# Patient Record
Sex: Male | Born: 1985 | Race: Black or African American | Hispanic: No | Marital: Single | Smoking: Never smoker
Health system: Southern US, Community
[De-identification: ages and names within clinical notes are randomized; demographics above are authoritative.]

## PROBLEM LIST (undated history)

## (undated) DIAGNOSIS — J45909 Unspecified asthma, uncomplicated: Secondary | ICD-10-CM

## (undated) HISTORY — PX: APPENDECTOMY: SHX54

---

## 2014-09-10 ENCOUNTER — Inpatient Hospital Stay (HOSPITAL_COMMUNITY)
Admission: EM | Admit: 2014-09-10 | Discharge: 2014-09-16 | DRG: 975 | Attending: Internal Medicine | Admitting: Internal Medicine

## 2014-09-10 ENCOUNTER — Encounter (HOSPITAL_COMMUNITY): Payer: Self-pay | Admitting: Emergency Medicine

## 2014-09-10 ENCOUNTER — Emergency Department (HOSPITAL_COMMUNITY)

## 2014-09-10 DIAGNOSIS — Z7951 Long term (current) use of inhaled steroids: Secondary | ICD-10-CM

## 2014-09-10 DIAGNOSIS — E871 Hypo-osmolality and hyponatremia: Secondary | ICD-10-CM | POA: Diagnosis present

## 2014-09-10 DIAGNOSIS — Z8701 Personal history of pneumonia (recurrent): Secondary | ICD-10-CM

## 2014-09-10 DIAGNOSIS — D649 Anemia, unspecified: Secondary | ICD-10-CM | POA: Diagnosis present

## 2014-09-10 DIAGNOSIS — B379 Candidiasis, unspecified: Secondary | ICD-10-CM | POA: Diagnosis not present

## 2014-09-10 DIAGNOSIS — J189 Pneumonia, unspecified organism: Secondary | ICD-10-CM | POA: Diagnosis present

## 2014-09-10 DIAGNOSIS — B59 Pneumocystosis: Secondary | ICD-10-CM | POA: Diagnosis present

## 2014-09-10 DIAGNOSIS — E860 Dehydration: Secondary | ICD-10-CM | POA: Diagnosis present

## 2014-09-10 DIAGNOSIS — J45909 Unspecified asthma, uncomplicated: Secondary | ICD-10-CM | POA: Diagnosis present

## 2014-09-10 DIAGNOSIS — B370 Candidal stomatitis: Secondary | ICD-10-CM | POA: Diagnosis present

## 2014-09-10 DIAGNOSIS — Z9889 Other specified postprocedural states: Secondary | ICD-10-CM

## 2014-09-10 DIAGNOSIS — Z653 Problems related to other legal circumstances: Secondary | ICD-10-CM | POA: Diagnosis not present

## 2014-09-10 DIAGNOSIS — B20 Human immunodeficiency virus [HIV] disease: Secondary | ICD-10-CM | POA: Diagnosis present

## 2014-09-10 DIAGNOSIS — Z21 Asymptomatic human immunodeficiency virus [HIV] infection status: Secondary | ICD-10-CM | POA: Diagnosis not present

## 2014-09-10 HISTORY — DX: Unspecified asthma, uncomplicated: J45.909

## 2014-09-10 LAB — CBC
HEMATOCRIT: 34.7 % — AB (ref 39.0–52.0)
HEMOGLOBIN: 11.7 g/dL — AB (ref 13.0–17.0)
MCH: 27.5 pg (ref 26.0–34.0)
MCHC: 33.7 g/dL (ref 30.0–36.0)
MCV: 81.5 fL (ref 78.0–100.0)
Platelets: 302 10*3/uL (ref 150–400)
RBC: 4.26 MIL/uL (ref 4.22–5.81)
RDW: 14.8 % (ref 11.5–15.5)
WBC: 9.5 10*3/uL (ref 4.0–10.5)

## 2014-09-10 LAB — STREP PNEUMONIAE URINARY ANTIGEN: STREP PNEUMO URINARY ANTIGEN: NEGATIVE

## 2014-09-10 LAB — BASIC METABOLIC PANEL
Anion gap: 11 (ref 5–15)
BUN: 12 mg/dL (ref 6–20)
CHLORIDE: 100 mmol/L — AB (ref 101–111)
CO2: 21 mmol/L — ABNORMAL LOW (ref 22–32)
CREATININE: 0.8 mg/dL (ref 0.61–1.24)
Calcium: 8.9 mg/dL (ref 8.9–10.3)
GFR calc Af Amer: >60 mL/min (ref >60)
GFR calc non Af Amer: >60 mL/min (ref >60)
Glucose, Bld: 103 mg/dL — ABNORMAL HIGH (ref 65–99)
Potassium: 3.7 mmol/L (ref 3.5–5.1)
Sodium: 132 mmol/L — ABNORMAL LOW (ref 135–145)

## 2014-09-10 LAB — I-STAT CG4 LACTIC ACID, ED: Lactic Acid, Venous: 0.64 mmol/L (ref 0.5–2.0)

## 2014-09-10 LAB — HIV ANTIBODY (ROUTINE TESTING W REFLEX): HIV Screen 4th Generation wRfx: REACTIVE — AB

## 2014-09-10 LAB — MRSA PCR SCREENING: MRSA by PCR: NEGATIVE

## 2014-09-10 LAB — RAPID STREP SCREEN (MED CTR MEBANE ONLY): Streptococcus, Group A Screen (Direct): NEGATIVE

## 2014-09-10 MED ORDER — ACETAMINOPHEN 325 MG PO TABS
650.0000 mg | ORAL_TABLET | Freq: Four times a day (QID) | ORAL | Status: DC | PRN
  Administered 2014-09-15: 650 mg via ORAL
  Filled 2014-09-10: qty 2

## 2014-09-10 MED ORDER — LEVOFLOXACIN IN D5W 750 MG/150ML IV SOLN
750.0000 mg | Freq: Once | INTRAVENOUS | Status: AC
  Administered 2014-09-10: 750 mg via INTRAVENOUS
  Filled 2014-09-10: qty 150

## 2014-09-10 MED ORDER — IPRATROPIUM BROMIDE 0.02 % IN SOLN
0.5000 mg | Freq: Once | RESPIRATORY_TRACT | Status: AC
  Administered 2014-09-10: 0.5 mg via RESPIRATORY_TRACT
  Filled 2014-09-10: qty 2.5

## 2014-09-10 MED ORDER — SODIUM CHLORIDE 0.9 % IV SOLN
INTRAVENOUS | Status: DC
  Administered 2014-09-10 – 2014-09-14 (×6): via INTRAVENOUS
  Administered 2014-09-14: 1000 mL via INTRAVENOUS
  Administered 2014-09-15 (×2): via INTRAVENOUS
  Administered 2014-09-15 – 2014-09-16 (×2): 1000 mL via INTRAVENOUS

## 2014-09-10 MED ORDER — ACETAMINOPHEN 325 MG PO TABS
650.0000 mg | ORAL_TABLET | Freq: Once | ORAL | Status: AC | PRN
  Administered 2014-09-10: 650 mg via ORAL
  Filled 2014-09-10: qty 2

## 2014-09-10 MED ORDER — ONDANSETRON HCL 4 MG PO TABS: 4.0000 mg | ORAL_TABLET | Freq: Four times a day (QID) | ORAL | Status: DC | PRN

## 2014-09-10 MED ORDER — ENOXAPARIN SODIUM 40 MG/0.4ML ~~LOC~~ SOLN
40.0000 mg | SUBCUTANEOUS | Status: DC
  Administered 2014-09-10 – 2014-09-14 (×5): 40 mg via SUBCUTANEOUS
  Filled 2014-09-10 (×5): qty 0.4

## 2014-09-10 MED ORDER — LEVOFLOXACIN IN D5W 750 MG/150ML IV SOLN
750.0000 mg | INTRAVENOUS | Status: DC
  Administered 2014-09-11 – 2014-09-12 (×2): 750 mg via INTRAVENOUS
  Filled 2014-09-10 (×3): qty 150

## 2014-09-10 MED ORDER — ACETAMINOPHEN 650 MG RE SUPP: 650.0000 mg | Freq: Four times a day (QID) | RECTAL | Status: DC | PRN

## 2014-09-10 MED ORDER — FLUCONAZOLE IN SODIUM CHLORIDE 200-0.9 MG/100ML-% IV SOLN: 200.0000 mg | INTRAVENOUS | Status: DC

## 2014-09-10 MED ORDER — ALBUTEROL SULFATE (2.5 MG/3ML) 0.083% IN NEBU
2.5000 mg | INHALATION_SOLUTION | Freq: Once | RESPIRATORY_TRACT | Status: AC
  Administered 2014-09-10: 2.5 mg via RESPIRATORY_TRACT
  Filled 2014-09-10: qty 3

## 2014-09-10 MED ORDER — FLUCONAZOLE 100 MG PO TABS
200.0000 mg | ORAL_TABLET | Freq: Every day | ORAL | Status: DC
  Administered 2014-09-11 – 2014-09-16 (×6): 200 mg via ORAL
  Filled 2014-09-10: qty 2
  Filled 2014-09-10 (×2): qty 1
  Filled 2014-09-10 (×2): qty 2
  Filled 2014-09-10: qty 1

## 2014-09-10 MED ORDER — FLUCONAZOLE 200 MG PO TABS
200.0000 mg | ORAL_TABLET | Freq: Once | ORAL | Status: AC
  Administered 2014-09-10: 200 mg via ORAL
  Filled 2014-09-10: qty 1

## 2014-09-10 MED ORDER — ALBUTEROL SULFATE (2.5 MG/3ML) 0.083% IN NEBU
2.5000 mg | INHALATION_SOLUTION | RESPIRATORY_TRACT | Status: DC | PRN
  Administered 2014-09-11 – 2014-09-14 (×5): 2.5 mg via RESPIRATORY_TRACT
  Filled 2014-09-10 (×5): qty 3

## 2014-09-10 MED ORDER — ALUM & MAG HYDROXIDE-SIMETH 200-200-20 MG/5ML PO SUSP: 30.0000 mL | Freq: Four times a day (QID) | ORAL | Status: DC | PRN

## 2014-09-10 MED ORDER — SODIUM CHLORIDE 0.9 % IV BOLUS (SEPSIS)
1000.0000 mL | Freq: Once | INTRAVENOUS | Status: AC
  Administered 2014-09-10: 1000 mL via INTRAVENOUS

## 2014-09-10 MED ORDER — CETYLPYRIDINIUM CHLORIDE 0.05 % MT LIQD
7.0000 mL | Freq: Two times a day (BID) | OROMUCOSAL | Status: DC
  Administered 2014-09-10 – 2014-09-16 (×11): 7 mL via OROMUCOSAL

## 2014-09-10 MED ORDER — HYDROMORPHONE HCL 1 MG/ML IJ SOLN: 0.5000 mg | INTRAMUSCULAR | Status: DC | PRN

## 2014-09-10 MED ORDER — ALBUTEROL SULFATE (2.5 MG/3ML) 0.083% IN NEBU
2.5000 mg | INHALATION_SOLUTION | Freq: Four times a day (QID) | RESPIRATORY_TRACT | Status: DC
  Filled 2014-09-10: qty 3

## 2014-09-10 MED ORDER — SULFAMETHOXAZOLE-TRIMETHOPRIM 800-160 MG PO TABS
1.0000 | ORAL_TABLET | Freq: Once | ORAL | Status: AC
  Administered 2014-09-10: 1 via ORAL
  Filled 2014-09-10: qty 1

## 2014-09-10 MED ORDER — ONDANSETRON HCL 4 MG/2ML IJ SOLN: 4.0000 mg | Freq: Four times a day (QID) | INTRAMUSCULAR | Status: DC | PRN

## 2014-09-10 NOTE — Progress Notes (Signed)
   Follow Up Note  Pt admitted earlier this morning.  Seen after arrived to floor.  Currently feeling somewhat better. No labored breathing. No pain.  Exam: Oral: Thrush on tongue CV: Regular rate and rhythm, S1-S2 Lungs: Good inspiratory effort, no wheezing. Abd: Soft, nontender, nondistended, positive bowel sounds Ext: No clubbing or cyanosis or edema  Present on Admission:  . CAP (community acquired pneumonia): Continue antibiotics. Noted multi lobar.  . Anemia, mild. Stable.  Marland Kitchen Hyponatremia: Secondary dehydration. Mild. Oral thrush: On Diflucan, awaiting HIV testing  Check ambulatory pulse ox. Awaiting HIV. Potential discharge tomorrow.

## 2014-09-10 NOTE — ED Notes (Signed)
Pt. Ambulated down the hall and back to his room on 94% room air, heart rate 80. Pt. Had no complaints.

## 2014-09-10 NOTE — ED Notes (Signed)
Called Respiratory to bedside. 

## 2014-09-10 NOTE — ED Notes (Signed)
Patient is feeling bad. He states that he have not felt good and went to infirmary for not feeling good. He also states that his asthma is acting up and is sweating.

## 2014-09-10 NOTE — ED Notes (Signed)
PA at bedside.

## 2014-09-10 NOTE — H&P (Addendum)
[facility] Admission History and Physical       Henry Mitchell ZOX:[id number] DOB: 1985-10-30 DOA: 09/10/2014  Referring physician: EDP PCP: No primary care provider on file.  Specialists:   Chief Complaint: SOB Cough Fevers and Chills  HPI: Henry Mitchell is a 29 y.o. male who was brought to the ED from the [facility] (with 2 officers at bedside)  with complaints of fevers and chills cough and SOB x 3 days.   He reports having night sweats.   He was found to have multifocal pneumonia on Chest X-ray and was placed on empiric antibiotic coverage of IV Levaquin and a dose of Oral Bactrim and referred for admission.    Of Note he was admitted with Pneumonia 3 months ago.     Review of Systems:  Constitutional: No Weight Loss, No Weight Gain, +Sweats, +Fevers, +Chills, Dizziness, Light Headedness, Fatigue, or Generalized Weakness HEENT: No Headaches, Difficulty Swallowing,Tooth/Dental Problems,Sore Throat,  No Sneezing, Rhinitis, Ear Ache, Nasal Congestion, or Post Nasal Drip,  Cardio-vascular:  No Chest pain, Orthopnea, PND, Edema in Lower Extremities, Anasarca, Dizziness, Palpitations  Resp: +Dyspnea, No DOE, +Productive Cough, No Non-Productive Cough, No Hemoptysis, No Wheezing.    GI: No Heartburn, Indigestion, Abdominal Pain, Nausea, Vomiting, Diarrhea, Constipation, Hematemesis, Hematochezia, Melena, Change in Bowel Habits,  Loss of Appetite  GU: No Dysuria, No Change in Color of Urine, No Urgency or Urinary Frequency, No Flank pain.  Musculoskeletal: No Joint Pain or Swelling, No Decreased Range of Motion, No Back Pain.  Neurologic: No Syncope, No Seizures, Muscle Weakness, Paresthesia, Vision Disturbance or Loss, No Diplopia, No Vertigo, No Difficulty Walking,  Skin: No Rash or Lesions. Psych: No Change in Mood or Affect, No Depression or Anxiety, No Memory loss, No Confusion, or Hallucinations   Past Medical History  Diagnosis Date  . Asthma      Past Surgical  History  Procedure Laterality Date  . Appendectomy        Prior to Admission medications   Medication Sig Start Date End Date Taking? Authorizing Provider  albuterol (PROVENTIL HFA;VENTOLIN HFA) 108 (90 BASE) MCG/ACT inhaler Inhale 2 puffs into the lungs every 4 (four) hours as needed for wheezing or shortness of breath.   Yes Historical Provider, MD     No Known Allergies  Social History:  reports that he has never smoked. He has never used smokeless tobacco. He reports that he does not drink alcohol or use illicit drugs.    History reviewed. No pertinent family history.     Physical Exam:  GEN:  Pleasant Thin  29 y.o. African American male examined and in no acute distress; cooperative with exam Filed Vitals:   09/10/14 0100 09/10/14 0130 09/10/14 0157 09/10/14 0339  BP: 116/77 118/75  111/56  Pulse: 109 115  106  Temp:    98.9 F (37.2 C)  TempSrc:    Oral  Resp: SpO2: 100% 99% 98% 94%   Blood pressure 111/56, pulse 106, temperature 98.9 F (37.2 C), temperature source Oral, resp. rate 24, SpO2 94 %. PSYCH: He is alert and oriented x4; does not appear anxious does not appear depressed; affect is normal HEENT: Normocephalic and Atraumatic, Mucous membranes pink; PERRLA; EOM intact; Fundi:  Benign;  No scleral icterus, Nares: Patent, Oropharynx: + white Tongue Exudates, Fair Dentition,    Neck:  FROM, No Cervical Lymphadenopathy nor Thyromegaly or Carotid Bruit; No JVD; Breasts:: Not examined CHEST WALL: No tenderness CHEST: Normal respiration, clear  to auscultation bilaterally HEART: Regular rate and rhythm; no murmurs rubs or gallops BACK: No kyphosis or scoliosis; No CVA tenderness ABDOMEN: Positive Bowel Sounds, Scaphoid, Soft Non-Tender, No Rebound or Guarding; No Masses, No Organomegaly Rectal Exam: Not done EXTREMITIES: No Cyanosis, Clubbing, or Edema; No Ulcerations. Genitalia: not examined PULSES: 2+ and symmetric SKIN: Normal hydration no rash or  ulceration CNS:  Alert and Oriented x 4, No Focal Deficits Vascular: pulses palpable throughout    Labs on Admission:  Basic Metabolic Panel:  Recent Labs Lab 09/10/14 0225  NA 132*  K 3.7  CL 100*  CO2 21*  GLUCOSE 103*  BUN 12  CREATININE 0.80  CALCIUM 8.9   Liver Function Tests: No results for input(s): AST, ALT, ALKPHOS, BILITOT, PROT, ALBUMIN in the last 168 hours. No results for input(s): LIPASE, AMYLASE in the last 168 hours. No results for input(s): AMMONIA in the last 168 hours. CBC:  Recent Labs Lab 09/10/14 0225  WBC 9.5  HGB 11.7*  HCT 34.7*  MCV 81.5  PLT 302   Cardiac Enzymes: No results for input(s): CKTOTAL, CKMB, CKMBINDEX, TROPONINI in the last 168 hours.  BNP (last 3 results) No results for input(s): BNP in the last 8760 hours.  ProBNP (last 3 results) No results for input(s): PROBNP in the last 8760 hours.  CBG: No results for input(s): GLUCAP in the last 168 hours.  Radiological Exams on Admission: Dg Chest 2 View  09/10/2014   CLINICAL DATA:  Cough and fever.  EXAM: CHEST  2 VIEW  COMPARISON:  None.  FINDINGS: Right upper lobe patchy consolidation, moderate in degree. Minimal patchy opacity in the left suprahilar and infrahilar lung. The heart size and mediastinal contours are normal. Pulmonary vasculature is normal. No pleural effusion or pneumothorax. No osseous abnormality.  IMPRESSION: Multifocal pneumonia, most significant in the right upper lobe.   Electronically Signed   By: Henry Mitchell M.D.   On: 09/10/2014 02:16     Assessment/Plan:   29 y.o. male with  Principal Problem:   1.    CAP (community acquired pneumonia)   IV Levaquin   PO Bactrim x 1 dose given   Albuterol Nebs PRN   O2 PRN   Active Problems:   2.    Oral candidiasis   Oral Fluconazole   Check  for HIV      3.    Anemia   Send Anemia panel   Send FOBT q day x3   Monitor Hb     4.    Hyponatremia- due to #1   IVFs     5.    DVT  Prophylaxis   Lovenox    Code Status:     FULL CODE      Family Communication:   No Family Present    Disposition Plan:    Inpatient Status        Time spent:  42 Minutes      Henry Mitchell [facility] Pager [phone]   If 7AM -7PM Please Contact the Day Rounding Team MD for [facility]  If 7PM-7AM, Please Contact Night-Floor Coverage  [email or web address] Password [email or web address] 09/10/2014, 5:08 AM     ADDENDUM:   Patient was seen and examined on 09/10/2014

## 2014-09-10 NOTE — ED Provider Notes (Signed)
CSN: [id number]     Arrival date & time 09/10/14  0018 History   First MD Initiated Contact with Patient 09/10/14 0059     Chief Complaint  Patient presents with  . Fever  . Thrush  . Sore Throat  . Cough     (Consider location/radiation/quality/duration/timing/severity/associated sxs/prior Treatment) HPI Comments: 29 year old male with a history of asthma who presents to the emergency department from [facility] for complaints of illness. He reports that he feels similar to when he had pneumonia a few months ago. He states that his symptoms began 3 days ago as a cough productive of green sputum. He has since developed central chest pain which is worse with deep breathing. Patient complaining of body aches. He has had nasal congestion as well as runny nose and a mild sore throat. He denies any known sick contacts. He uses an albuterol inhaler for his asthma. No other medications taken prior to arrival, per patient. Fever of 100.32F noted on arrival.  Patient is a 29 y.o. male presenting with fever, pharyngitis, and cough. The history is provided by the patient. No language interpreter was used.  Fever Associated symptoms: chest pain, chills, cough and myalgias   Sore Throat Associated symptoms include chest pain, chills, coughing, a fever and myalgias.  Cough Associated symptoms: chest pain, chills, fever, myalgias and shortness of breath     Past Medical History  Diagnosis Date  . Asthma    Past Surgical History  Procedure Laterality Date  . Appendectomy     History reviewed. No pertinent family history. Social History  Substance Use Topics  . Smoking status: Never Smoker   . Smokeless tobacco: Never Used  . Alcohol Use: No    Review of Systems  Constitutional: Positive for fever and chills.  Respiratory: Positive for cough, chest tightness and shortness of breath.   Cardiovascular: Positive for chest pain.  Musculoskeletal: Positive for myalgias.  All other systems  reviewed and are negative.   Allergies  Review of patient's allergies indicates no known allergies.  Home Medications   Prior to Admission medications   Medication Sig Start Date End Date Taking? Authorizing Provider  albuterol (PROVENTIL HFA;VENTOLIN HFA) 108 (90 BASE) MCG/ACT inhaler Inhale 2 puffs into the lungs every 4 (four) hours as needed for wheezing or shortness of breath.   Yes Historical Provider, MD   BP 118/75 mmHg  Pulse 115  Temp(Src) 100.7 F (38.2 C) (Oral)  Resp 24  SpO2 98%   Physical Exam  Constitutional: He is oriented to person, place, and time. He appears well-developed and well-nourished. No distress.  HENT:  Head: Normocephalic and atraumatic.  Diffuse oral thrush noted to hard and soft palate, buccal mucosa, and tongue. Patient tolerating secretions without difficulty.  Eyes: Conjunctivae and EOM are normal. No scleral icterus.  Neck: Normal range of motion.  Cardiovascular: Regular rhythm and intact distal pulses.   Tachycardia  Pulmonary/Chest: Effort normal. No respiratory distress. He has no wheezes. He has rales.  Diffuse rhonchi on expiration. Rales appreciated in b/l upper lobes. No wheezes appreciated. Chest expansion symmetric. Mild tachypnea without dyspnea.  Musculoskeletal: Normal range of motion.  Neurological: He is alert and oriented to person, place, and time. He exhibits normal muscle tone. Coordination normal.  Skin: Skin is warm and dry. No rash noted. He is not diaphoretic. No erythema. No pallor.  Psychiatric: He has a normal mood and affect. His behavior is normal.  Nursing note and vitals reviewed.   ED Course  Procedures (including critical care time) Labs Review Labs Reviewed  CBC - Abnormal; Notable for the following:    Hemoglobin 11.7 (*)    HCT 34.7 (*)    All other components within normal limits  BASIC METABOLIC PANEL - Abnormal; Notable for the following:    Sodium 132 (*)    Chloride 100 (*)    CO2 21 (*)     Glucose, Bld 103 (*)    All other components within normal limits  RAPID STREP SCREEN (NOT AT [facility])  CULTURE, GROUP A STREP  CULTURE, BLOOD (ROUTINE X 2)  CULTURE, BLOOD (ROUTINE X 2)  HIV ANTIBODY (ROUTINE TESTING)  I-STAT CG4 LACTIC ACID, ED    Imaging Review Dg Chest 2 View  09/10/2014   CLINICAL DATA:  Cough and fever.  EXAM: CHEST  2 VIEW  COMPARISON:  None.  FINDINGS: Right upper lobe patchy consolidation, moderate in degree. Minimal patchy opacity in the left suprahilar and infrahilar lung. The heart size and mediastinal contours are normal. Pulmonary vasculature is normal. No pleural effusion or pneumothorax. No osseous abnormality.  IMPRESSION: Multifocal pneumonia, most significant in the right upper lobe.   Electronically Signed   By: Rubye Oaks M.D.   On: 09/10/2014 02:16   I have personally reviewed and evaluated these images and lab results as part of my medical decision-making.   EKG Interpretation None      MDM   Final diagnoses:  CAP (community acquired pneumonia)  Oral candidiasis    29 year old male, currently incarcerated, presents [facility] the emergency department from jail for complaints of cough, upper respiratory symptoms, and fever. Patient febrile to 100.58F on arrival. He was also noted to be tachycardic which improved with IV fluids. He responded well to Tylenol. Workup today shows a multifocal pneumonia, most significant in the right upper lobe.  Patient's physical exam today was significant for extensive oral thrush. Patient also reports a history of pneumonia approximately 6 months ago. He states that he has had oral thrush in the past which has been hard to resolve with nystatin alone. He was put on Diflucan tablets which eventually helped him. He has been having distant issues with thrush over the past year. Patient also states he has been having fairly frequent pneumonias over the past year. He denies concern for HIV, but has not been tested for HIV in  the last few years. He cannot recall any encounter where HIV may have been transmitted to him.  Patient given IV Levaquin in the emergency department for treatment of multifocal pneumonia. There is, however, an underlying concern that patient's pneumonia may be related to undiagnosed HIV also causing worsening thrush. Patient given Bactrim in the emergency department as well for prophylaxis until HIV test result. Patient to be admitted for additional antibiotics and monitoring given the extent of his pneumonia on chest xray with current incarceration.   Filed Vitals:   09/10/14 0100 09/10/14 0130 09/10/14 0157 09/10/14 0339  BP: 116/77 118/75  111/56  Pulse: 109 115  106  Temp:    98.9 F (37.2 C)  TempSrc:    Oral  Resp: SpO2: 100% 99% 98% 94%     Antony Madura, PA-C 09/10/14 0531  Marisa Severin, MD 09/10/14 2110

## 2014-09-11 DIAGNOSIS — B20 Human immunodeficiency virus [HIV] disease: Secondary | ICD-10-CM | POA: Diagnosis present

## 2014-09-11 LAB — BASIC METABOLIC PANEL
ANION GAP: 5 (ref 5–15)
BUN: 8 mg/dL (ref 6–20)
CHLORIDE: 106 mmol/L (ref 101–111)
CO2: 23 mmol/L (ref 22–32)
Calcium: 8.8 mg/dL — ABNORMAL LOW (ref 8.9–10.3)
Creatinine, Ser: 0.82 mg/dL (ref 0.61–1.24)
GFR calc Af Amer: >60 mL/min (ref >60)
GFR calc non Af Amer: >60 mL/min (ref >60)
GLUCOSE: 98 mg/dL (ref 65–99)
POTASSIUM: 4.4 mmol/L (ref 3.5–5.1)
Sodium: 134 mmol/L — ABNORMAL LOW (ref 135–145)

## 2014-09-11 LAB — CBC
HEMATOCRIT: 36.6 % — AB (ref 39.0–52.0)
HEMOGLOBIN: 12.1 g/dL — AB (ref 13.0–17.0)
MCH: 27.1 pg (ref 26.0–34.0)
MCHC: 33.1 g/dL (ref 30.0–36.0)
MCV: 81.9 fL (ref 78.0–100.0)
Platelets: 327 10*3/uL (ref 150–400)
RBC: 4.47 MIL/uL (ref 4.22–5.81)
RDW: 14.9 % (ref 11.5–15.5)
WBC: 10.1 10*3/uL (ref 4.0–10.5)

## 2014-09-11 NOTE — Progress Notes (Signed)
Henry NOTE  Christell Constant ZOX:[id number] DOB: 28-Apr-1985 DOA: 09/10/2014 PCP: No primary care provider on file.  HPI/Recap of past 80 hours: 29 year old African-American male admitted from jail on the early morning of 8/27 for multilobar pneumonia and oral thrush. Patient started on antibiotics and Diflucan. 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 He is understandably upset. No previous history. He states he had a negative test several years ago. He does not know how he contracted this. Denies any IV drug use. CD4 count ordered and is pending. Spoke with infectious disease. We'll make this somewhat difficult as the patient is from [place] and given that he has no insurance, we'll need to enroll in [facility]'s medication assistance program area Active Problems:   CAP (community acquired pneumonia): Does not appear his PCP pneumonia. Continue antibiotics   Oral candidiasis: Continue Diflucan   Anemia: Mild.   Hyponatremia   Code Status: Full code  Family Communication: Unable to contact family due to prison rules  Disposition Plan: Anticipate discharge in next 1-2 days once CD4 count back and arrangements made for follow-up   Consultants:  Case discussed with infectious disease  Procedures:  None  Antibiotics:  Levaquin 8/27-present   Objective: BP 110/63 mmHg  Pulse 100  Temp(Src) 98.9 F (37.2 C) (Oral)  Resp 16  Ht 5\' 7"  (1.702 m)  Wt 73.6 kg (162 lb 4.1 oz)  BMI 25.41 kg/m2  SpO2 100%  Intake/Output Summary (Last 24 hours) at 09/11/14 1316 Last data filed at 09/11/14 0900  Gross per 24 hour  Intake   1120 ml  Output   1075 ml  Net     45 ml   Filed Weights   09/10/14 0900 09/11/14 0444  Weight: 74.2 kg (163 lb 9.3 oz) 73.6 kg (162 lb 4.1 oz)     Exam:   General:  Alert and oriented 3, in moderate distress from diagnosis  HEENT: Oral thrush noted  Cardiovascular: Regular rate and rhythm, S1-S2  Respiratory: Clear to auscultation bilaterally  Abdomen: Soft nontender, nondistended, positive bowel sounds  Musculoskeletal: No clubbing or cyanosis or edema   Data Reviewed: Basic Metabolic Panel:  Recent Labs Lab 09/10/14 0225 09/11/14 0444  NA 132* 134*  K 3.7 4.4  CL 100* 106  CO2 21* 23  GLUCOSE 103* 98  BUN 12 8  CREATININE 0.80 0.82  CALCIUM 8.9 8.8*   Liver Function Tests: No results for input(s): AST, ALT, ALKPHOS, BILITOT, PROT, ALBUMIN in the last 168 hours. No results for input(s): LIPASE, AMYLASE in the last 168 hours. No results for input(s): AMMONIA in the last 168 hours. CBC:  Recent Labs Lab 09/10/14 0225 09/11/14 0444  WBC 9.5 10.1  HGB 11.7* 12.1*  HCT 34.7* 36.6*  MCV 81.5 81.9  PLT 302 327   Cardiac Enzymes:   No results for input(s): CKTOTAL, CKMB, CKMBINDEX, TROPONINI in the last 168 hours. BNP (last 3 results) No results for input(s): BNP in the last 8760 hours.  ProBNP (last 3 results) No results for input(s): PROBNP in the last 8760 hours.  CBG: No results for input(s): GLUCAP in the last 168 hours.  Recent Results (from the past 240 hour(s))  Rapid strep screen (not at [facility])     Status: None   Collection Time: 09/10/14  1:21 AM  Result Value Ref  Range Status   Streptococcus, Group A Screen (Direct) NEGATIVE NEGATIVE Final    Comment: (NOTE) A Rapid Antigen test may result negative if the antigen level in the sample is below the detection level of this test. The FDA has not cleared this test as a stand-alone test therefore the rapid antigen negative result has reflexed to a Group A Strep culture.   MRSA PCR Screening     Status: None   Collection Time: 09/10/14  6:33 AM  Result Value Ref Range Status   MRSA by PCR NEGATIVE NEGATIVE Final    Comment:        The  GeneXpert MRSA Assay (FDA approved for NASAL specimens only), is one component of a comprehensive MRSA colonization surveillance program. It is not intended to diagnose MRSA infection nor to guide or monitor treatment for MRSA infections.      Studies: No results found.  Scheduled Meds: . antiseptic oral rinse  7 mL Mouth Rinse BID  . enoxaparin (LOVENOX) injection  40 mg Subcutaneous Q24H  . fluconazole  200 mg Oral Daily  . levofloxacin (LEVAQUIN) IV  750 mg Intravenous Q24H    Continuous Infusions: . sodium chloride 100 mL/hr at 09/11/14 4098     Time spent: 25 minutes  Hollice Espy  [facility] Pager [phone]. If 7PM-7AM, please contact night-coverage at [email or web address], password [facility] 09/11/2014, 1:16 PM  LOS: 1 day

## 2014-09-11 NOTE — Progress Notes (Signed)
Utilization review completed.  

## 2014-09-11 NOTE — Progress Notes (Addendum)
Pt was sitting up and awake when I arrived. One or two guards were present during entire visit. 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 He described his reaction and how he felt. He said in addition to his learning this, he can't talk to his family because he is here. (Prison rules will not allow contact with family.)  Early during the visit and the entire visit, pt spoke of his faith in God. He said [place] is [place], is saved and knows that God sits high and looks low. Toward the end of our visit he spoke of having a purpose in all this and said he knows that God can still use him. Pt said he has a girlfriend with whom he will have to share this news. He said he also has three children ages 81 years and 2 children are 1 year olds. I listened as pt described his illness, faith, feelings about his new diagnosis. We talked about the importance of taking one day, sometimes one hour at a time.  He seemed less overwhelmed about everything during that part of our discussion.  Provided pastoral care and prayer for pt.  He was very grateful for visit, encouragement and prayer. Encouraged him to call if he needs additional support.  Please page when pt needs a visit. [phone] Chaplain Elmarie Shiley Holder   09/11/14 1900  Clinical Encounter Type  Visited With Patient

## 2014-09-12 LAB — T-HELPER CELLS (CD4) COUNT (NOT AT ARMC)
CD4 T CELL ABS: 10 /uL — AB (ref 400–2700)
CD4 T CELL HELPER: 2 % — AB (ref 33–55)

## 2014-09-12 LAB — LEGIONELLA ANTIGEN, URINE

## 2014-09-12 LAB — CULTURE, GROUP A STREP: STREP A CULTURE: NEGATIVE

## 2014-09-12 MED ORDER — LEVOFLOXACIN 750 MG PO TABS
750.0000 mg | ORAL_TABLET | ORAL | Status: DC
  Administered 2014-09-12 – 2014-09-13 (×2): 750 mg via ORAL
  Filled 2014-09-12 (×3): qty 1

## 2014-09-12 MED ORDER — TUBERCULIN PPD 5 UNIT/0.1ML ID SOLN
5.0000 [IU] | Freq: Once | INTRADERMAL | Status: AC
  Administered 2014-09-12: 5 [IU] via INTRADERMAL
  Filled 2014-09-12: qty 0.1

## 2014-09-12 NOTE — Progress Notes (Signed)
PPD placed on L forearm.  To be read 09/14/14.

## 2014-09-12 NOTE — Progress Notes (Signed)
PROGRESS NOTE  Christell Constant AVW:[id number] DOB: [birth year] DOA: 09/10/2014 PCP: No primary care provider on file.  HPI/Recap of past 63 hours: 29 year old African-American male admitted from jail on the early morning of 8/27 for multilobar pneumonia and oral thrush. Patient started on antibiotics and Diflucan. Responding well, breathing comfortably on room air. HIV testing came back positive, new diagnosis for patient.  Patient today doing okay. He was understandably upset yesterday after getting this new diagnosis. CD4 count ordered and is still pending. 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 He is understandably upset. No previous history. He states he had a negative test several years ago. He does not know how he contracted this. Denies any IV drug use. CD4 count ordered and is pending. Spoke with infectious disease. We'll make this somewhat difficult as the patient is from [place] and given that he has no insurance, we'll need to enroll in [facility] medication assistance program area Active Problems:   CAP (community acquired pneumonia): Does not appear his PCP pneumonia. Continue antibiotics, changed to by mouth   Oral candidiasis: Continue Diflucan   Anemia: Mild.   Hyponatremia   Code Status: Full code  Family Communication: Unable to contact family due to prison rules  Disposition Plan: Anticipate discharge in next 1-2 days once CD4 count back and arrangements made for follow-up   Consultants:  Case discussed with infectious disease  Procedures:  None  Antibiotics:  Levaquin 8/27-present   Objective: BP 103/66 mmHg  Pulse 103  Temp(Src) 97.9 F (36.6 C) (Oral)  Resp 16  Ht 5\' 7"  (1.702 m)  Wt 73.6 kg (162 lb 4.1 oz)  BMI 25.41 kg/m2  SpO2 100%  Intake/Output  Summary (Last 24 hours) at 09/12/14 1504 Last data filed at 09/12/14 1423  Gross per 24 hour  Intake   1965 ml  Output      0 ml  Net   1965 ml   Filed Weights   09/10/14 0900 09/11/14 0444  Weight: 74.2 kg (163 lb 9.3 oz) 73.6 kg (162 lb 4.1 oz)    Exam:   General:  Alert and oriented 3, breathing comfortably  HEENT: Oral thrush noted  Cardiovascular: Regular rate and rhythm, S1-S2  Respiratory: Clear to auscultation bilaterally, better airway exchange  Abdomen: Soft nontender, nondistended, positive bowel sounds  Musculoskeletal: No clubbing or cyanosis or edema   Data Reviewed: Basic Metabolic Panel:  Recent Labs Lab 09/10/14 0225 09/11/14 0444  NA 132* 134*  K 3.7 4.4  CL 100* 106  CO2 21* 23  GLUCOSE 103* 98  BUN 12 8  CREATININE 0.80 0.82  CALCIUM 8.9 8.8*   Liver Function Tests: No results for input(s): AST, ALT, ALKPHOS, BILITOT, PROT, ALBUMIN in the last 168 hours. No results for input(s): LIPASE, AMYLASE in the last 168 hours. No results for input(s): AMMONIA in the last 168 hours. CBC:  Recent Labs Lab 09/10/14 0225 09/11/14 0444  WBC 9.5 10.1  HGB 11.7* 12.1*  HCT 34.7* 36.6*  MCV 81.5 81.9  PLT 302 327   Cardiac Enzymes:   No results for input(s): CKTOTAL, CKMB, CKMBINDEX, TROPONINI in the last 168 hours. BNP (last 3 results) No results for input(s): BNP in the last 8760 hours.  ProBNP (last 3 results) No results for input(s): PROBNP in the last 8760 hours.  CBG: No results for input(s): GLUCAP in the last 168 hours.  Recent Results (from the past 240 hour(s))  Rapid strep screen (not at [facility])     Status: None   Collection Time: 09/10/14  1:21 AM  Result Value Ref Range Status   Streptococcus, Group A Screen (Direct) NEGATIVE NEGATIVE Final    Comment: (NOTE) A Rapid Antigen test may result negative if the antigen level in the sample is below the detection level of this test. The FDA has not cleared this test as a stand-alone  test therefore the rapid antigen negative result has reflexed to a Group A Strep culture.   Culture, blood (routine x 2)     Status: None (Preliminary result)   Collection Time: 09/10/14  2:30 AM  Result Value Ref Range Status   Specimen Description BLOOD RIGHT FOREARM  Final   Special Requests BOTTLES DRAWN AEROBIC AND ANAEROBIC  Final   Culture   Final    NO GROWTH 2 DAYS Performed at [facility]    Report Status PENDING  Incomplete  Culture, blood (routine x 2)     Status: None (Preliminary result)   Collection Time: 09/10/14  2:32 AM  Result Value Ref Range Status   Specimen Description BLOOD LEFT ANTECUBITAL  Final   Special Requests BOTTLES DRAWN AEROBIC AND ANAEROBIC  Final   Culture   Final    NO GROWTH 2 DAYS Performed at [facility]    Report Status PENDING  Incomplete  MRSA PCR Screening     Status: None   Collection Time: 09/10/14  6:33 AM  Result Value Ref Range Status   MRSA by PCR NEGATIVE NEGATIVE Final    Comment:        The GeneXpert MRSA Assay (FDA approved for NASAL specimens only), is one component of a comprehensive MRSA colonization surveillance program. It is not intended to diagnose MRSA infection nor to guide or monitor treatment for MRSA infections.      Studies: No results found.  Scheduled Meds: . antiseptic oral rinse  7 mL Mouth Rinse BID  . enoxaparin (LOVENOX) injection  40 mg Subcutaneous Q24H  . fluconazole  200 mg Oral Daily  . levofloxacin  750 mg Oral Q24H    Continuous Infusions: . sodium chloride 100 mL/hr at 09/12/14 1202     Time spent: 15 minutes  Hollice Espy  [facility] Pager [phone]. If 7PM-7AM, please contact night-coverage at [email or web address], password [facility] 09/12/2014, 3:04 PM  LOS: 2 days

## 2014-09-12 NOTE — Progress Notes (Signed)
PHARMACIST - PHYSICIAN COMMUNICATION DR:   TR[facility] CONCERNING: Antibiotic IV to Oral Route Change Policy  RECOMMENDATION: This patient is receiving levofloxacin by the intravenous route.  Based on criteria approved by the Pharmacy and Therapeutics Committee, the antibiotic(s) is/are being converted to the equivalent oral dose form(s).   DESCRIPTION: These criteria include:  Patient being treated for a respiratory tract infection, urinary tract infection, cellulitis or clostridium difficile associated diarrhea if on metronidazole  The patient is not neutropenic and does not exhibit a GI malabsorption state  The patient is eating (either orally or via tube) and/or has been taking other orally administered medications for a least 24 hours  The patient is improving clinically and has a Tmax < 100.5  (WBC was WNL and on room air with good O2 sats)  If you have questions about this conversion, please contact the Pharmacy Department    [phone] )  [facility]   [phone] )  [facility]   [phone] )  [facility]   [phone] )  [facility]   [phone] )  Ilene Qua    Juliette Alcide, PharmD, BCPS.   Pager: [phone] 09/12/2014 8:31 AM

## 2014-09-13 DIAGNOSIS — B370 Candidal stomatitis: Secondary | ICD-10-CM

## 2014-09-13 DIAGNOSIS — Z21 Asymptomatic human immunodeficiency virus [HIV] infection status: Secondary | ICD-10-CM

## 2014-09-13 DIAGNOSIS — J189 Pneumonia, unspecified organism: Principal | ICD-10-CM

## 2014-09-13 LAB — CRYPTOCOCCAL ANTIGEN: Crypto Ag: NEGATIVE

## 2014-09-13 MED ORDER — ELVITEG-COBIC-EMTRICIT-TENOFAF 150-150-200-10 MG PO TABS
1.0000 | ORAL_TABLET | Freq: Every day | ORAL | Status: DC
  Administered 2014-09-14 – 2014-09-16 (×2): 1 via ORAL
  Filled 2014-09-13 (×5): qty 1

## 2014-09-13 MED ORDER — SULFAMETHOXAZOLE-TRIMETHOPRIM 800-160 MG PO TABS: 1.0000 | ORAL_TABLET | Freq: Two times a day (BID) | ORAL | Status: DC

## 2014-09-13 MED ORDER — AZITHROMYCIN 600 MG PO TABS: 1200.0000 mg | ORAL_TABLET | ORAL | Status: DC

## 2014-09-13 MED ORDER — SULFAMETHOXAZOLE-TRIMETHOPRIM 800-160 MG PO TABS
1.0000 | ORAL_TABLET | Freq: Every day | ORAL | Status: DC
  Administered 2014-09-13 – 2014-09-14 (×2): 1 via ORAL
  Filled 2014-09-13 (×2): qty 1

## 2014-09-13 MED ORDER — AZITHROMYCIN 500 MG PO TABS
500.0000 mg | ORAL_TABLET | Freq: Every day | ORAL | Status: DC
Start: 2014-09-13 — End: 2014-09-13

## 2014-09-13 NOTE — Progress Notes (Signed)
PROGRESS NOTE  Henry Mitchell [id number]:[id number] DOB: 1985-12-20 DOA: 09/10/2014 PCP: No primary care provider on file.  HPI/Recap of past 16 hours: 29 year old African-American male admitted from jail on the early morning of 8/27 for multilobar pneumonia and oral thrush. Patient started on antibiotics and Diflucan. Responding well, breathing comfortably on room air. HIV testing came back positive, new diagnosis for patient.  Patient today doing okay. Spiked fever last night. Feels like breathing is overall stable. Anxious to have plan in place so that he can be discharged. CD4 came back at 10 and infectious disease consulted. Patient started on prophylactic Zithromax and Bactrim plus anti-retrovirals.   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 He is understandably upset. No previous history. He states he had a negative test several years ago. He does not know how he contracted this. Denies any IV drug use. Appreciate infectious disease help. Once he is completed Levaquin course, can start weekly Zithromax. Now on Bactrim. Now on anti-retrovirals. Viral load, genotype and other labs ordered. Patient is at increased risk for TB, although low likelihood. Have ordered PPD, but he may not be able to amount enough of the immune response. ID recommending bronchoscopy which they will talk to pulmonary about.   fficult as the patient is from [place] and given that he has no insurance, we'll need to enroll in [facility]'s medication assistance program area Active Problems:   CAP (community acquired pneumonia): Does not appear his PCP pneumonia. Continue antibiotics, changed to by mouth    Oral candidiasis: Continue fluconazole   Anemia: Mild.   Hyponatremia Given concerns and risk for TB  Code Status: Full code  Family Communication: Unable to contact family due to prison rules  Disposition Plan: Anticipate  discharge in next 1-2 days once CD4 count back and arrangements made for follow-up   Consultants:  Infectious disease  Pulmonary   Procedures Planned bronchoscopy  Antibiotics:  Levaquin 8/27-present (continue until 9/2 )   fluconazole 8/27-present  Bactrim DS daily indefinite  Zithromax start once Levaquin finished   Objective: BP 107/64 mmHg  Pulse 102  Temp(Src) 99 F (37.2 C) (Oral)  Resp 20  Ht  (1.702 m)  Wt 73.6 kg (162 lb 4.1 oz)  BMI 25.41 kg/m2  SpO2 99%  Intake/Output Summary (Last 24 hours) at 09/13/14 1717 Last data filed at 09/13/14 1100  Gross per 24 hour  Intake    715 ml  Output      0 ml  Net    715 ml   Filed Weights   09/10/14 0900 09/11/14 0444  Weight: 74.2 kg (163 lb 9.3 oz) 73.6 kg (162 lb 4.1 oz)    Exam:   General:  Alert and oriented 3, breathing comfortably  HEENT: Oral thrush noted  Cardiovascular: Regular rate and rhythm, S1-S2  Respiratory: Clear to auscultation bilaterally  Abdomen: Soft nontender, nondistended, positive bowel sounds  Musculoskeletal: No clubbing or cyanosis or edema   Data Reviewed: Basic Metabolic Panel:  Recent Labs Lab 09/10/14 0225 09/11/14 0444  NA 132* 134*  K 3.7 4.4  CL 100* 106  CO2 21* 23  GLUCOSE 103* 98  BUN 12 8  CREATININE 0.80 0.82  CALCIUM 8.9 8.8*   Liver Function Tests: No results for input(s): AST, ALT, ALKPHOS, BILITOT, PROT, ALBUMIN in the last 168 hours. No results for input(s): LIPASE, AMYLASE in the last 168 hours. No results for input(s): AMMONIA in the last 168  hours. CBC:  Recent Labs Lab 09/10/14 0225 09/11/14 0444  WBC 9.5 10.1  HGB 11.7* 12.1*  HCT 34.7* 36.6*  MCV 81.5 81.9  PLT 302 327   Cardiac Enzymes:   No results for input(s): CKTOTAL, CKMB, CKMBINDEX, TROPONINI in the last 168 hours. BNP (last 3 results) No results for input(s): BNP in the last 8760 hours.  ProBNP (last 3 results) No results for input(s): PROBNP in the last  8760 hours.  CBG: No results for input(s): GLUCAP in the last 168 hours.  Recent Results (from the past 240 hour(s))  Rapid strep screen (not at [facility])     Status: None   Collection Time: 09/10/14  1:21 AM  Result Value Ref Range Status   Streptococcus, Group A Screen (Direct) NEGATIVE NEGATIVE Final    Comment: (NOTE) A Rapid Antigen test may result negative if the antigen level in the sample is below the detection level of this test. The FDA has not cleared this test as a stand-alone test therefore the rapid antigen negative result has reflexed to a Group A Strep culture.   Culture, Group A Strep     Status: None   Collection Time: 09/10/14  1:21 AM  Result Value Ref Range Status   Strep A Culture Negative  Final    Comment: (NOTE) Performed At: [facility] [address] [place], [place] [id number] Mila Homer MD TG:[id number]   Culture, blood (routine x 2)     Status: None (Preliminary result)   Collection Time: 09/10/14  2:30 AM  Result Value Ref Range Status   Specimen Description BLOOD RIGHT FOREARM  Final   Special Requests BOTTLES DRAWN AEROBIC AND ANAEROBIC  Final   Culture   Final    NO GROWTH 3 DAYS Performed at [facility]    Report Status PENDING  Incomplete  Culture, blood (routine x 2)     Status: None (Preliminary result)   Collection Time: 09/10/14  2:32 AM  Result Value Ref Range Status   Specimen Description BLOOD LEFT ANTECUBITAL  Final   Special Requests BOTTLES DRAWN AEROBIC AND ANAEROBIC  Final   Culture   Final    NO GROWTH 3 DAYS Performed at [facility]    Report Status PENDING  Incomplete  MRSA PCR Screening     Status: None   Collection Time: 09/10/14  6:33 AM  Result Value Ref Range Status   MRSA by PCR NEGATIVE NEGATIVE Final    Comment:        The GeneXpert MRSA Assay (FDA approved for NASAL specimens only), is one component of a comprehensive MRSA colonization surveillance program. It is  not intended to diagnose MRSA infection nor to guide or monitor treatment for MRSA infections.      Studies: No results found.  Scheduled Meds: . antiseptic oral rinse  7 mL Mouth Rinse BID  . [START ON 09/17/2014] azithromycin  1,200 mg Oral Weekly  . [START ON 09/14/2014] elvitegravir-cobicistat-emtricitabine-tenofovir  1 tablet Oral Q breakfast  . enoxaparin (LOVENOX) injection  40 mg Subcutaneous Q24H  . fluconazole  200 mg Oral Daily  . levofloxacin  750 mg Oral Q24H  . sulfamethoxazole-trimethoprim  1 tablet Oral Daily  . tuberculin  5 Units Intradermal Once    Continuous Infusions: . sodium chloride 100 mL/hr at 09/13/14 1701     Time spent: 25 minutes  Hollice Espy  [facility] Pager [phone]. If 7PM-7AM, please contact night-coverage at [email or web address], password [facility]  09/13/2014, 5:17 PM  LOS: 3 days

## 2014-09-13 NOTE — Consult Note (Signed)
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 male incarcerated male who presents with multifocal pneumonia, but found to have advanced HIV disease, CD 4 count of 10. VL is pending. The patient is originally from [place] but has been in [place] recently, he is presently incarcerated in [employer] jail for the past 2 weeks. He states that he has not been previously incarcerated. He denies being homeless. He last tested for HIV in 2009 and it was negative at the time. He is in a relationship with a woman for the past 2 years. She is has been tested roughly 2-3 wks ago and is negative. He denies any IVDU. Denies sex with men.  He reports having dry cough, chills, intermittent nightsweats < 5 days which brought him to the hospital for evaluation. No weight loss, no rash, no diarrhea, no headache. He was skin tested for mTB at entry to jail which it was negative. On initial exam, he was found to have thrush, which prompted consultation with high probability for cd 4 count < 200.  i have reviewed his cxr imaging that is consistent with multifocal pneumonia including upper lungfield infiltrate  Past Medical History  Diagnosis Date  . Asthma     Allergies: No Known Allergies   MEDICATIONS: . antiseptic oral rinse  7 mL Mouth Rinse BID  . [START ON 09/17/2014] azithromycin  1,200 mg Oral Weekly  . enoxaparin (LOVENOX) injection  40 mg Subcutaneous Q24H  . fluconazole  200 mg Oral Daily  . levofloxacin  750 mg Oral Q24H  . sulfamethoxazole-trimethoprim  1 tablet Oral Daily  . tuberculin  5 Units  Intradermal Once    Social History  Substance Use Topics  . Smoking status: Never Smoker   . Smokeless tobacco: Never Used  . Alcohol Use: No    Family history  = diabetes   Review of Systems  Constitutional: positive for fever, chills, diaphoresis, activity change, appetite change, fatigue and unexpected weight change.  HENT: Negative for congestion, sore throat, rhinorrhea, sneezing, trouble swallowing and sinus pressure.  Eyes: Negative for photophobia and visual disturbance.  Respiratory: positivefor cough, negative chest tightness, shortness of breath, wheezing and stridor.  Cardiovascular: Negative for chest pain, palpitations and leg swelling.  Gastrointestinal: Negative for nausea, vomiting, abdominal pain, diarrhea, constipation, blood in stool, abdominal distention and anal bleeding.  Genitourinary: Negative for dysuria, hematuria, flank pain and difficulty urinating.  Musculoskeletal: Negative for myalgias, back pain, joint swelling, arthralgias and gait problem.  Skin: Negative for color change, pallor, rash and wound.  Neurological: Negative for dizziness, tremors, weakness and light-headedness.  Hematological: Negative for adenopathy. Does not bruise/bleed easily.  Psychiatric/Behavioral: Negative for behavioral problems, confusion, sleep disturbance, dysphoric mood, decreased concentration and agitation.     OBJECTIVE: Temp:  [98.2 F (36.8 C)-102.9 F (39.4 C)] 99 F (37.2 C) (08/30 1352) Pulse Rate:  [72-102] 102 (08/30 1352) Resp:  [16-20] 20 (08/30 1352) BP: (98-110)/(52-68) 107/64 mmHg (08/30 1352)  SpO2:  [98 %-100 %] 99 % (08/30 1352) Physical Exam  Constitutional: He is oriented to person, place, and time. He appears well-developed and well-nourished. No distress.  HENT: +mild thrush Mouth/Throat: Oropharynx is clear and moist. No oropharyngeal exudate.  Cardiovascular: Normal rate, regular rhythm and normal heart sounds. Exam reveals no gallop and no  friction rub.  No murmur heard.  Pulmonary/Chest: Effort normal and breath sounds normal. No respiratory distress. Decrease breath sounds on left upper. Wheezing on right lower lung fields Abdominal: Soft. Bowel sounds are normal. He exhibits no distension. There is no tenderness.  Lymphadenopathy:  He has no cervical adenopathy.  Neurological: He is alert and oriented to person, place, and time.  Skin: Skin is warm and dry. No rash noted. No erythema.  Psychiatric: He has a normal mood and affect. His behavior is normal.     LABS: Lab Results  Component Value Date   WBC 10.1 09/11/2014   HGB 12.1* 09/11/2014   HCT 36.6* 09/11/2014   MCV 81.9 09/11/2014   PLT 327 09/11/2014   BMET    Component Value Date/Time   NA 134* 09/11/2014 0444   K 4.4 09/11/2014 0444   CL 106 09/11/2014 0444   CO2 23 09/11/2014 0444   GLUCOSE 98 09/11/2014 0444   BUN 8 09/11/2014 0444   CREATININE 0.82 09/11/2014 0444   CALCIUM 8.8* 09/11/2014 0444   GFRNONAA >60 09/11/2014 0444   GFRAA >60 09/11/2014 0444   Lab Results  Component Value Date   CD4TCELL 2* 09/11/2014   CD4TABS 10* 09/11/2014     MICRO: 8/27 blood cx x 2 8/27 group a strep negative IMAGING: 8/27 cxr Right upper lobe patchy consolidation, moderate in degree. Minimal patchy opacity in the left suprahilar and infrahilar lung. The heart size and mediastinal contours are normal. Pulmonary vasculature is normal. No pleural effusion or pneumothorax. No osseous abnormality.  IMPRESSION: Multifocal pneumonia, most significant in the right upper lobe.   Assessment/Plan: 29yo AAM with multifocal pneumonia and thrush, found to have newly diagnosed advanced hiv disease, cd 4 count of 10  hiv disease = will check hiv viral load with genotype, will check hep a ,b, and c. As well as RPR, serum cryptococcal antigen. Will start him on stribild due to advanced hiv disease. Will have jail continue his treatment and then they will  provide 30 day worth of treatmentfor him to cntinue once he re-establishes care in [facility].  CAP = continue with levofloxacin since he appears to be improving. Concern that he has risk factors for mTB, thus recommend to rule out for mTB with BAL/bronch. Patient does not have productive cough presently and would expedite the diagnosis if he gets bronchoscopy. In the meantime, i will order afb smear adn place on airborne precaution. This is low likelihood that he has mTb but has risk factors of incarceration and advanced hiv disease.  oi prophylaxis = will have him on bactrim ds daily plus azithromycin 1200mg  q weekly  Oral candidiasis = continue with fluconazole 200mg  daily x 10-14 days

## 2014-09-14 DIAGNOSIS — B379 Candidiasis, unspecified: Secondary | ICD-10-CM

## 2014-09-14 LAB — HIV-1 RNA ULTRAQUANT REFLEX TO GENTYP+: HIV-1 RNA Quant, Log: UNDETERMINED log10copy/mL

## 2014-09-14 LAB — COMPREHENSIVE METABOLIC PANEL
ALBUMIN: 3 g/dL — AB (ref 3.5–5.0)
ALK PHOS: 47 U/L (ref 38–126)
ALT: 27 U/L (ref 17–63)
AST: 32 U/L (ref 15–41)
Anion gap: 8 (ref 5–15)
BUN: 10 mg/dL (ref 6–20)
CALCIUM: 8.9 mg/dL (ref 8.9–10.3)
CO2: 23 mmol/L (ref 22–32)
CREATININE: 0.98 mg/dL (ref 0.61–1.24)
Chloride: 103 mmol/L (ref 101–111)
GFR calc non Af Amer: >60 mL/min (ref >60)
GLUCOSE: 103 mg/dL — AB (ref 65–99)
Potassium: 4.4 mmol/L (ref 3.5–5.1)
SODIUM: 134 mmol/L — AB (ref 135–145)
Total Bilirubin: 0.4 mg/dL (ref 0.3–1.2)
Total Protein: 7 g/dL (ref 6.5–8.1)

## 2014-09-14 LAB — CBC WITH DIFFERENTIAL/PLATELET
Basophils Absolute: 0 10*3/uL (ref 0.0–0.1)
Basophils Relative: 0 % (ref 0–1)
EOS ABS: 0.3 10*3/uL (ref 0.0–0.7)
Eosinophils Relative: 3 % (ref 0–5)
HCT: 36 % — ABNORMAL LOW (ref 39.0–52.0)
HEMOGLOBIN: 11.8 g/dL — AB (ref 13.0–17.0)
LYMPHS ABS: 0.9 10*3/uL (ref 0.7–4.0)
Lymphocytes Relative: 10 % — ABNORMAL LOW (ref 12–46)
MCH: 26.9 pg (ref 26.0–34.0)
MCHC: 32.8 g/dL (ref 30.0–36.0)
MCV: 82 fL (ref 78.0–100.0)
Monocytes Absolute: 0.9 10*3/uL (ref 0.1–1.0)
Monocytes Relative: 9 % (ref 3–12)
NEUTROS ABS: 7.6 10*3/uL (ref 1.7–7.7)
NEUTROS PCT: 78 % — AB (ref 43–77)
Platelets: 372 10*3/uL (ref 150–400)
RBC: 4.39 MIL/uL (ref 4.22–5.81)
RDW: 14.7 % (ref 11.5–15.5)
WBC: 9.7 10*3/uL (ref 4.0–10.5)

## 2014-09-14 LAB — HEPATITIS B SURFACE ANTIGEN: Hepatitis B Surface Ag: NEGATIVE

## 2014-09-14 LAB — HEPATITIS A ANTIBODY, TOTAL: Hep A Total Ab: NEGATIVE

## 2014-09-14 LAB — HEPATITIS B SURFACE ANTIBODY, QUANTITATIVE: Hepatitis B-Post: >1000 m[IU]/mL (ref >9.9)

## 2014-09-14 LAB — HEPATITIS C ANTIBODY

## 2014-09-14 LAB — RPR: RPR: NONREACTIVE

## 2014-09-14 MED ORDER — SULFAMETHOXAZOLE-TRIMETHOPRIM 800-160 MG PO TABS
2.0000 | ORAL_TABLET | Freq: Three times a day (TID) | ORAL | Status: DC
Start: 2014-09-14 — End: 2014-09-16
  Administered 2014-09-14 – 2014-09-16 (×5): 2 via ORAL
  Filled 2014-09-14: qty 2
  Filled 2014-09-14: qty 1
  Filled 2014-09-14: qty 2
  Filled 2014-09-14: qty 1
  Filled 2014-09-14 (×2): qty 2

## 2014-09-14 NOTE — Progress Notes (Addendum)
[facility]    Date of Admission:  09/10/2014   Total days of antibiotics 6        Day 6 levo        Day 2 bactrim proph        Day 4 fluconazole   ID: Henry Mitchell is a 29 y.o. male with newly diagnosed hiv disease with thrush multifocal pneumonia concerning for atypical infection such as pcp vs. 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 antiseptic oral rinse  7 mL Mouth Rinse BID  . [START ON 09/17/2014] azithromycin  1,200 mg Oral Weekly  . elvitegravir-cobicistat-emtricitabine-tenofovir  1 tablet Oral Q breakfast  . enoxaparin (LOVENOX) injection  40 mg Subcutaneous Q24H  . fluconazole  200 mg Oral Daily  . levofloxacin  750 mg Oral Q24H  . sulfamethoxazole-trimethoprim  1 tablet Oral Daily    Objective: Vital signs in last 24 hours: Temp:  [98.3 F (36.8 C)-99.3 F (37.4 C)] 98.3 F (36.8 C) (08/31 1329) Pulse Rate:  [74-102] 87 (08/31 1329) Resp:  [18-20] 20 (08/31 1329) BP: (105-127)/(57-64) 108/64 mmHg (08/31 1329) SpO2:  [99 %-100 %] 99 % (08/31 1329) Physical Exam  Constitutional: He is oriented to person, place, and time. He appears well-developed and well-nourished. No distress.  HENT:  Mouth/Throat: Oropharynx is clear and moist. No oropharyngeal exudate. Thrush + Cardiovascular: Normal rate, regular rhythm and normal heart sounds. Exam reveals no gallop and no friction rub.  No murmur heard.  Pulmonary/Chest: Effort normal and breath sounds normal. No respiratory distress. He has no wheezes.  Abdominal: Soft. Bowel sounds are normal. He exhibits no distension. There is no tenderness.  Lymphadenopathy:  He has no cervical adenopathy.  Neurological: He is alert and oriented to person, place, and time.  Skin: Skin is warm and  dry. No rash noted. No erythema.  Psychiatric: He has a normal mood and affect. His behavior is normal.     Lab Results  Recent Labs  09/14/14 0400  WBC 9.7  HGB 11.8*  HCT 36.0*  NA 134*  K 4.4  CL 103  CO2 23  BUN 10  CREATININE 0.98   Liver Panel  Recent Labs  09/14/14 0400  PROT 7.0  ALBUMIN 3.0*  AST 32  ALT 27  ALKPHOS 47  BILITOT 0.4   Lab Results  Component Value Date   CD4TCELL 2* 09/11/2014   CD4TABS 10* 09/11/2014    Microbiology: Hep B immune Negative for hep C, RPR, crypto  Assessment/Plan: Pneumonia = he has finished treatment for cap. We will change him to treatment for presumed PCP in the meantime.radiographically looks more like pcp, but responding to levofloxacin for cap. Would treat with bactrim DS 2 tabs TID x21 days. Still plan to get bronch for BAL to send for PCP stain and mTB testing due to risk factors for incarceration  hiv disease = advanced hiv disease with CD 4 count of 10, started on genvoya. Viral load and genotype pending  Oral thrush = would treat with thrush for 14 days with fluconazole 200mg  daily, currently day 4 of 14.  oi proph = continue with azithromycin 1200mg  Q week. Change bactrim dosing to single strength daily once he finishes PCP course of treatment  Discussed plan with  dr. Vassie Loll and Bjorn Loser, [facility] Cell: [phone] Pager: [phone]  09/14/2014, 5:25 PM

## 2014-09-14 NOTE — Progress Notes (Signed)
Patient has negative PPD results,4mm,no induration,Dr. Regalado notified.

## 2014-09-14 NOTE — Consult Note (Signed)
Name: Henry Mitchell MRN: [id number] DOB: [birth year]    ADMISSION DATE:  09/10/2014 CONSULTATION DATE:  09/14/2014  REFERRING MD :  Drue Second -ID  CHIEF COMPLAINT:  Pneumonia   HISTORY OF PRESENT ILLNESS:  29 year old nonsmoker from [place] who is [facility] for the past 2 weeks. He has 2 officers at the bedside and he is in chains. He was transferred with complaints of fevers, cough productive of clear sputum and shortness of breath for 3 days. He also reported night sweats. Chest x-ray showed bilateral infiltrates cystoscopy for multifocal pneumonia. Subsequent testing was positive for HIV with CD4 count of 10. He was noted to have thrush-apparently his skin test for tuberculosis was negative on entry to jail. He is being retested now. He denies history of IV drug use or multiple sexual contacts.  He denies history suggestive of previous opportunistic infections such as pneumonia. We are asked by infectious disease to perform a bronchoscopy since he is not producing much sputum He does appear to be afebrile and denies dyspnea  PAST MEDICAL HISTORY :   has a past medical history of Asthma.  has past surgical history that includes Appendectomy. Prior to Admission medications   Medication Sig Start Date End Date Taking? Authorizing Provider  albuterol (PROVENTIL HFA;VENTOLIN HFA) 108 (90 BASE) MCG/ACT inhaler Inhale 2 puffs into the lungs every 4 (four) hours as needed for wheezing or shortness of breath.   Yes Historical Provider, MD   No Known Allergies  FAMILY HISTORY:  family history is not on file. SOCIAL HISTORY:  reports that he has never smoked. He has never used smokeless tobacco. He reports that he does not drink alcohol or use illicit drugs.  REVIEW OF SYSTEMS:   Constitutional: Positive for fever, chills, 5 pound weight loss, malaise/fatigue and diaphoresis.  HENT: Negative for hearing loss, ear pain, nosebleeds, congestion, sore throat, neck pain,  tinnitus and ear discharge.   Eyes: Negative for blurred vision, double vision, photophobia, pain, discharge and redness.  Respiratory: Negative for hemoptysis, sputum production, wheezing and stridor.   Cardiovascular: Negative for chest pain, palpitations, orthopnea, claudication, leg swelling and PND.  Gastrointestinal: Negative for heartburn, nausea, vomiting, abdominal pain, diarrhea, constipation, blood in stool and melena.  Genitourinary: Negative for dysuria, urgency, frequency, hematuria and flank pain.  Musculoskeletal: Negative for myalgias, back pain, joint pain and falls.  Skin: Negative for itching and rash.  Neurological: Negative for dizziness, tingling, tremors, sensory change, speech change, focal weakness, seizures, loss of consciousness, weakness and headaches.  Endo/Heme/Allergies: Negative for environmental allergies and polydipsia. Does not bruise/bleed easily.  SUBJECTIVE:   VITAL SIGNS: Temp:  [98.3 F (36.8 C)-99.3 F (37.4 C)] 99.3 F (37.4 C) (08/31 1031) Pulse Rate:  [74-102] 102 (08/31 1031) Resp:  [18-20] 20 (08/31 1031) BP: (105-127)/(57-64) 105/57 mmHg (08/31 1031) SpO2:  [99 %-100 %] 100 % (08/31 1031)  PHYSICAL EXAMINATION: Gen. Pleasant, well-nourished, in no distress, anxious affect ENT - no lesions, no post nasal drip Neck: No JVD, no thyromegaly, no carotid bruits Lungs: no use of accessory muscles, no dullness to percussion, fine crackles right base , no rhonchi  Cardiovascular: Rhythm regular, heart sounds  normal, no murmurs, no peripheral edema Abdomen: soft and non-tender, no hepatosplenomegaly, BS normal. Musculoskeletal: No deformities, no cyanosis or clubbing Neuro:  alert, non focal Skin:  Warm, no lesions/ rash    Recent Labs Lab 09/10/14 0225 09/11/14 0444 09/14/14 0400  NA 132* 134* 134*  K 3.7 4.4  4.4  CL 100* 106 103  CO2 21* 23 23  BUN CREATININE 0.80 0.82 0.98  GLUCOSE 103* 98 103*    Recent Labs Lab  09/10/14 0225 09/11/14 0444 09/14/14 0400  HGB 11.7* 12.1* 11.8*  HCT 34.7* 36.6* 36.0*  WBC 9.5 10.1 9.7  PLT 302 327 372   No results found.  ASSESSMENT / PLAN:  Community-acquired pneumonia- history of incarceration does raise the possibility of tuberculosis, but he appears to have been in jail only for 2 weeks. 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 Sputum specimens have been difficult to obtain. There is also some risk to releasing him to the jail environment without clear diagnosis  Recommend- after discussion with ID, I think it is reasonable to proceed with bronchoscopy and obtain a bronchoalveolar lavage for PCP and trans bronchial biopsies for uncommon organisms.  The risks of bronchoscopy were discussed.The risks of procedure including coughing, bleeding and the  chances of lung puncture requiring chest tube were discussed in great detail. The benefits & alternatives including serial follow up were also discussed.  He is willing to proceed-bronchoscopy scheduled for 8 AM on 9/1 Nothing by mouth after midnight tonight  ALVA,RAKESH V.  MD [phone]    09/14/2014, 12:00 PM

## 2014-09-14 NOTE — Care Management Note (Signed)
Case Management Note  Patient DetTravarius LangeDexter XXXJones MRN: [id number] Date of Birth: 03-Feb-1985  Subjective/Objective:        HIV disease            Action/Plan: Pt currently [place] in [place] [facility] [place] is from [place]  Expected Discharge Date:                  Expected Discharge Plan:  Tax inspector  In-House Referral:     Discharge planning Services  CM Consult  Post Acute Care Choice:    Choice offered to:     DME Arranged:    DME Agency:     HH Arranged:    HH Agency:     Status of Service:  In process, will continue to follow  Medicare Important Message Given:    Date Medicare IM Given:    Medicare IM give by:    Date Additional Medicare IM Given:    Additional Medicare Important Message give by:     If discussed at Long Length of Stay Meetings, dates discussed:    Additional Comments: Chart reviewed and CM following for DC needs. Bartholome Bill, RN 09/14/2014, 3:09 PM

## 2014-09-14 NOTE — Progress Notes (Signed)
PROGRESS NOTE  Henry Mitchell ZHY:[id number] DOB: [birth year] DOA: 09/10/2014 PCP: No primary care provider on file.  HPI 29 year old African-American male admitted from jail on the early morning of 8/27 for multilobar pneumonia and oral thrush. Patient started on antibiotics and Diflucan. Marland Kitchen HIV testing came back positive, new diagnosis for patient.  he is breathing ok, anxious to be discharge.   Assessment/Plan: HIV,  Continue with  Bactrim, Zithromax.virals. ptient is at increased risk for TB, although low likelihood. PPD negative. ID recommending bronchoscopy. Bronchoscopy 9-1.   CAP (community acquired pneumonia): Continue antibiotics, levaquin.  Oral candidiasis: Continue fluconazole Anemia: Mild. Hyponatremia Given concerns and risk for TB  Code Status: Full code  Family Communication: Unable to contact family due to prison rules  Disposition Plan: Anticipate discharge when results available fom ronchosocopy.    Consultants:  Infectious disease  Pulmonary   Procedures Planned bronchoscopy  Antibiotics:  Levaquin 8/27-present (continue until 9/2 )   fluconazole 8/27-present  Bactrim DS daily indefinite  Zithromax start once Levaquin finished   Objective: BP 108/64 mmHg  Pulse 87  Temp(Src) 98.3 F (36.8 C) (Oral)  Resp 20  Ht 5\' 7"  (1.702 m)  Wt 73.6 kg (162 lb 4.1 oz)  BMI 25.41 kg/m2  SpO2 99%  Intake/Output Summary (Last 24 hours) at 09/14/14 1700 Last data filed at 09/14/14 1417  Gross per 24 hour  Intake   2175 ml  Output      0 ml  Net   2175 ml   Filed Weights   09/10/14 0900 09/11/14 0444  Weight: 74.2 kg (163 lb 9.3 oz) 73.6 kg (162 lb 4.1 oz)    Exam:   General:  Alert and oriented 3, breathing comfortably  HEENT: Oral thrush noted  Cardiovascular: Regular rate and rhythm, S1-S2  Respiratory: Clear to auscultation bilaterally  Abdomen: Soft nontender, nondistended, positive bowel sounds  Musculoskeletal: No clubbing or  cyanosis or edema   Data Reviewed: Basic Metabolic Panel:  Recent Labs Lab 09/10/14 0225 09/11/14 0444 09/14/14 0400  NA 132* 134* 134*  K 3.7 4.4 4.4  CL 100* 106 103  CO2 21* 23 23  GLUCOSE 103* 98 103*  BUN 12 8 10   CREATININE 0.80 0.82 0.98  CALCIUM 8.9 8.8* 8.9   Liver Function Tests:  Recent Labs Lab 09/14/14 0400  AST 32  ALT 27  ALKPHOS 47  BILITOT 0.4  PROT 7.0  ALBUMIN 3.0*   No results for input(s): LIPASE, AMYLASE in the last 168 hours. No results for input(s): AMMONIA in the last 168 hours. CBC:  Recent Labs Lab 09/10/14 0225 09/11/14 0444 09/14/14 0400  WBC 9.5 10.1 9.7  NEUTROABS  --   --  7.6  HGB 11.7* 12.1* 11.8*  HCT 34.7* 36.6* 36.0*  MCV 81.5 81.9 82.0  PLT 302 327 372   Cardiac Enzymes:   No results for input(s): CKTOTAL, CKMB, CKMBINDEX, TROPONINI in the last 168 hours. BNP (last 3 results) No results for input(s): BNP in the last 8760 hours.  ProBNP (last 3 results) No results for input(s): PROBNP in the last 8760 hours.  CBG: No results for input(s): GLUCAP in the last 168 hours.  Recent Results (from the past 240 hour(s))  Rapid strep screen (not at [facility])     Status: None   Collection Time: 09/10/14  1:21 AM  Result Value Ref Range Status   Streptococcus, Group A Screen (Direct) NEGATIVE NEGATIVE Final    Comment: (NOTE) A Rapid Antigen test may  result negative if the antigen level in the sample is below the detection level of this test. The FDA has not cleared this test as a stand-alone test therefore the rapid antigen negative result has reflexed to a Group A Strep culture.   Culture, Group A Strep     Status: None   Collection Time: 09/10/14  1:21 AM  Result Value Ref Range Status   Strep A Culture Negative  Final    Comment: (NOTE) Performed At: [facility] [address] [place], [place] [id number] Mila Homer MD WU:[id number]   Culture, blood (routine x 2)     Status: None (Preliminary result)     Collection Time: 09/10/14  2:30 AM  Result Value Ref Range Status   Specimen Description BLOOD RIGHT FOREARM  Final   Special Requests BOTTLES DRAWN AEROBIC AND ANAEROBIC  Final   Culture   Final    NO GROWTH 4 DAYS Performed at [facility]    Report Status PENDING  Incomplete  Culture, blood (routine x 2)     Status: None (Preliminary result)   Collection Time: 09/10/14  2:32 AM  Result Value Ref Range Status   Specimen Description BLOOD LEFT ANTECUBITAL  Final   Special Requests BOTTLES DRAWN AEROBIC AND ANAEROBIC  Final   Culture   Final    NO GROWTH 4 DAYS Performed at [facility]    Report Status PENDING  Incomplete  MRSA PCR Screening     Status: None   Collection Time: 09/10/14  6:33 AM  Result Value Ref Range Status   MRSA by PCR NEGATIVE NEGATIVE Final    Comment:        The GeneXpert MRSA Assay (FDA approved for NASAL specimens only), is one component of a comprehensive MRSA colonization surveillance program. It is not intended to diagnose MRSA infection nor to guide or monitor treatment for MRSA infections.      Studies: No results found.  Scheduled Meds: . antiseptic oral rinse  7 mL Mouth Rinse BID  . [START ON 09/17/2014] azithromycin  1,200 mg Oral Weekly  . elvitegravir-cobicistat-emtricitabine-tenofovir  1 tablet Oral Q breakfast  . enoxaparin (LOVENOX) injection  40 mg Subcutaneous Q24H  . fluconazole  200 mg Oral Daily  . levofloxacin  750 mg Oral Q24H  . sulfamethoxazole-trimethoprim  1 tablet Oral Daily    Continuous Infusions: . sodium chloride 1,000 mL (09/14/14 1325)     Time spent: 25 minutes  Markeise Mathews A  [facility] Pager [phone], please contact night-coverage at [email or web address], password [facility] 09/14/2014, 5:00 PM  LOS: 4 days

## 2014-09-14 NOTE — Progress Notes (Signed)
Clarified precaution with Dr. Benedetto Coons airborne,contact precaution d/c'd.Will continue to monitor.

## 2014-09-15 ENCOUNTER — Inpatient Hospital Stay (HOSPITAL_COMMUNITY)

## 2014-09-15 ENCOUNTER — Encounter (HOSPITAL_COMMUNITY): Admission: EM | Payer: Self-pay | Source: Home / Self Care | Attending: Internal Medicine

## 2014-09-15 DIAGNOSIS — E871 Hypo-osmolality and hyponatremia: Secondary | ICD-10-CM

## 2014-09-15 HISTORY — PX: VIDEO BRONCHOSCOPY: SHX5072

## 2014-09-15 LAB — BASIC METABOLIC PANEL
Anion gap: 10 (ref 5–15)
BUN: 9 mg/dL (ref 6–20)
CHLORIDE: 103 mmol/L (ref 101–111)
CO2: 23 mmol/L (ref 22–32)
CREATININE: 0.9 mg/dL (ref 0.61–1.24)
Calcium: 9.2 mg/dL (ref 8.9–10.3)
GFR calc Af Amer: >60 mL/min (ref >60)
GFR calc non Af Amer: >60 mL/min (ref >60)
GLUCOSE: 104 mg/dL — AB (ref 65–99)
POTASSIUM: 4 mmol/L (ref 3.5–5.1)
SODIUM: 136 mmol/L (ref 135–145)

## 2014-09-15 LAB — CBC
HCT: 34.5 % — ABNORMAL LOW (ref 39.0–52.0)
HEMATOCRIT: 38.9 % — AB (ref 39.0–52.0)
HEMOGLOBIN: 12.7 g/dL — AB (ref 13.0–17.0)
Hemoglobin: 11.3 g/dL — ABNORMAL LOW (ref 13.0–17.0)
MCH: 26.9 pg (ref 26.0–34.0)
MCH: 26.8 pg (ref 26.0–34.0)
MCHC: 32.6 g/dL (ref 30.0–36.0)
MCHC: 32.8 g/dL (ref 30.0–36.0)
MCV: 82.4 fL (ref 78.0–100.0)
MCV: 81.8 fL (ref 78.0–100.0)
PLATELETS: 351 10*3/uL (ref 150–400)
Platelets: 394 10*3/uL (ref 150–400)
RBC: 4.72 MIL/uL (ref 4.22–5.81)
RBC: 4.22 MIL/uL (ref 4.22–5.81)
RDW: 14.7 % (ref 11.5–15.5)
RDW: 14.8 % (ref 11.5–15.5)
WBC: 7.2 10*3/uL (ref 4.0–10.5)
WBC: 6.8 10*3/uL (ref 4.0–10.5)

## 2014-09-15 LAB — PNEUMOCYSTIS JIROVECI SMEAR BY DFA: Pneumocystis jiroveci Ag: POSITIVE

## 2014-09-15 LAB — CULTURE, BLOOD (ROUTINE X 2)
Culture: NO GROWTH
Culture: NO GROWTH

## 2014-09-15 LAB — LACTIC ACID, PLASMA: Lactic Acid, Venous: 1.5 mmol/L (ref 0.5–2.0)

## 2014-09-15 SURGERY — BRONCHOSCOPY, WITH FLUOROSCOPY
Anesthesia: Moderate Sedation | Laterality: Bilateral

## 2014-09-15 MED ORDER — MIDAZOLAM HCL 10 MG/2ML IJ SOLN
INTRAMUSCULAR | Status: DC | PRN
  Administered 2014-09-15 (×4): 1 mg via INTRAVENOUS

## 2014-09-15 MED ORDER — FENTANYL CITRATE (PF) 100 MCG/2ML IJ SOLN
INTRAMUSCULAR | Status: AC
  Filled 2014-09-15: qty 2

## 2014-09-15 MED ORDER — SODIUM CHLORIDE 0.9 % IV BOLUS (SEPSIS)
1000.0000 mL | Freq: Once | INTRAVENOUS | Status: AC
  Administered 2014-09-15: 1000 mL via INTRAVENOUS

## 2014-09-15 MED ORDER — LIDOCAINE HCL 2 % EX GEL: 1.0000 "application " | Freq: Once | CUTANEOUS | Status: DC

## 2014-09-15 MED ORDER — FENTANYL CITRATE (PF) 100 MCG/2ML IJ SOLN
INTRAMUSCULAR | Status: DC | PRN
  Administered 2014-09-15 (×4): 25 ug via INTRAVENOUS

## 2014-09-15 MED ORDER — BUTAMBEN-TETRACAINE-BENZOCAINE 2-2-14 % EX AERO: 1.0000 | INHALATION_SPRAY | Freq: Once | CUTANEOUS | Status: DC

## 2014-09-15 MED ORDER — FENTANYL CITRATE (PF) 100 MCG/2ML IJ SOLN
25.0000 ug | Freq: Once | INTRAMUSCULAR | Status: DC
Start: 2014-09-15 — End: 2014-09-16

## 2014-09-15 MED ORDER — PHENYLEPHRINE HCL 0.25 % NA SOLN: 1.0000 | Freq: Four times a day (QID) | NASAL | Status: DC | PRN

## 2014-09-15 MED ORDER — LIDOCAINE HCL 1 % IJ SOLN
INTRAMUSCULAR | Status: DC | PRN
  Administered 2014-09-15: 6 mL

## 2014-09-15 MED ORDER — MIDAZOLAM HCL 5 MG/ML IJ SOLN
INTRAMUSCULAR | Status: AC
Start: 2014-09-15 — End: 2014-09-15
  Filled 2014-09-15: qty 2

## 2014-09-15 MED ORDER — MIDAZOLAM HCL 2 MG/2ML IJ SOLN
1.0000 mg | Freq: Once | INTRAMUSCULAR | Status: DC
Start: 2014-09-15 — End: 2014-09-16
  Filled 2014-09-15: qty 4

## 2014-09-15 MED ORDER — LIDOCAINE HCL 2 % EX GEL
CUTANEOUS | Status: DC | PRN
  Administered 2014-09-15: 1

## 2014-09-15 MED ORDER — PHENYLEPHRINE HCL 0.25 % NA SOLN
NASAL | Status: DC | PRN
  Administered 2014-09-15: 2 via NASAL

## 2014-09-15 NOTE — Op Note (Signed)
Indication : Bilateral unexplained reticulonodular infiltrates in this non smoker with new diagnosis of HIV & low CD 4 count Written informed consent was obtained prior to the procedure. The risks of the procedure including coughing, bleeding and the small chance of lung puncture requiring chest tube were discussed in great detail. The benefits & alternatives including serial follow up were also discussed.  4 mg versed & 100  mcg fentnayl used in divided doses during the procedure Bronchoscope entered from the right nare. Upper airway nml Vocal cords showed nml appearance & motion. Trachea & bronchial tree examined to the subsegmental level. Mild amount of white secretions were noted. No endobronchial lesions seen. Trans bronchial biopsies x 4 were obtained from the RLL under fluoroscopy. BAL was also obtained from the RLL & RUL.  There was moderate coughing  during the procedure.   Specimens sent  - BAL for afb, fungal, culture & PCP & cytology -TBBX for path  A CXR will be performed to r/o presence of pneumothorax.  Oretha Milch MD  [phone]

## 2014-09-15 NOTE — Progress Notes (Signed)
Video bronchscopy with washing intervention, biopsy intervention. All vitals good thru out procedure. Report called to [facility] RN about meds, and specimens, an vitals.

## 2014-09-15 NOTE — Progress Notes (Signed)
Patient ID: Henry Mitchell, male   DOB: Feb 22, 1985, 29 y.o.   MRN: [id number]         [facility]    Date of Admission:  09/10/2014   Total days of antibiotics 7        Day 7 fluconazole        Day 3 trimethoprim sulfamethoxazole        Day 2 Genvoya  Principal Problem:   HIV disease Active Problems:   CAP (community acquired pneumonia)   Oral candidiasis   Anemia   Hyponatremia   . antiseptic oral rinse  7 mL Mouth Rinse BID  . [START ON 09/17/2014] azithromycin  1,200 mg Oral Weekly  . elvitegravir-cobicistat-emtricitabine-tenofovir  1 tablet Oral Q breakfast  . fentaNYL (SUBLIMAZE) injection  25-100 mcg Intravenous Once  . fluconazole  200 mg Oral Daily  . midazolam  1-4 mg Intravenous Once  . sulfamethoxazole-trimethoprim  2 tablet Oral TID    SUBJECTIVE: He states that he is feeling much better. He states that he does not have any shortness of breath (but he has not been out of bed). He says he only coughs now when he is laying flat. He is not bringing up any sputum. He states that his appetite is better. He denies any problem swallowing but his nurse tells me he has refused to [facility] today because of difficulty swallowing. He wants to know when he will be discharged.  Review of Systems: Pertinent items are noted in HPI.  Past Medical History  Diagnosis Date  . Asthma     Social History  Substance Use Topics  . Smoking status: Never Smoker   . Smokeless tobacco: Never Used  . Alcohol Use: No    History reviewed. No pertinent family history. No Known Allergies  OBJECTIVE: Filed Vitals:   09/15/14 1259 09/15/14 1341 09/15/14 1502 09/15/14 1504  BP: 94/53  Pulse: 110   115  Temp: 99.3 F (37.4 C)   100.6 F (38.1 C)  TempSrc: Oral     Resp: 20   24  Height:      Weight:      SpO2: 98%   95%   Body mass index is 25.37 kg/(m^2).  General: Smiling and in good spirits  Skin: No rash  Oral: Coated tongue. No  thrush noted  Lungs: Clear  Cor: Regular S1 and S2 with no murmurs  Abdomen: Soft and nontender  Lab Results Lab Results  Component Value Date   WBC 7.2 09/15/2014   HGB 12.7* 09/15/2014   HCT 38.9* 09/15/2014   MCV 82.4 09/15/2014   PLT 394 09/15/2014    Lab Results  Component Value Date   CREATININE 0.90 09/15/2014   BUN 9 09/15/2014   NA 136 09/15/2014   K 4.0 09/15/2014   CL 103 09/15/2014   CO2 23 09/15/2014    Lab Results  Component Value Date   ALT 27 09/14/2014   AST 32 09/14/2014   ALKPHOS 47 09/14/2014   BILITOT 0.4 09/14/2014     Microbiology: Recent Results (from the past 240 hour(s))  Rapid strep screen (not at [facility])     Status: None   Collection Time: 09/10/14  1:21 AM  Result Value Ref Range Status   Streptococcus, Group A Screen (Direct) NEGATIVE NEGATIVE Final    Comment: (NOTE) A Rapid Antigen test may result negative if the antigen level in the sample is below the detection level of this test.  The FDA has not cleared this test as a stand-alone test therefore the rapid antigen negative result has reflexed to a Group A Strep culture.   Culture, Group A Strep     Status: None   Collection Time: 09/10/14  1:21 AM  Result Value Ref Range Status   Strep A Culture Negative  Final    Comment: (NOTE) Performed At: [facility] [address] [place], [place] [id number] Mila Homer MD WU:[id number]   Culture, blood (routine x 2)     Status: None   Collection Time: 09/10/14  2:30 AM  Result Value Ref Range Status   Specimen Description BLOOD RIGHT FOREARM  Final   Special Requests BOTTLES DRAWN AEROBIC AND ANAEROBIC  Final   Culture   Final    NO GROWTH 5 DAYS Performed at [facility]    Report Status 09/15/2014 FINAL  Final  Culture, blood (routine x 2)     Status: None   Collection Time: 09/10/14  2:32 AM  Result Value Ref Range Status   Specimen Description BLOOD LEFT ANTECUBITAL  Final   Special Requests BOTTLES  DRAWN AEROBIC AND ANAEROBIC  Final   Culture   Final    NO GROWTH 5 DAYS Performed at [facility]    Report Status 09/15/2014 FINAL  Final  MRSA PCR Screening     Status: None   Collection Time: 09/10/14  6:33 AM  Result Value Ref Range Status   MRSA by PCR NEGATIVE NEGATIVE Final    Comment:        The GeneXpert MRSA Assay (FDA approved for NASAL specimens only), is one component of a comprehensive MRSA colonization surveillance program. It is not intended to diagnose MRSA infection nor to guide or monitor treatment for MRSA infections.      ASSESSMENT: His chest x-ray looked a little worse today but he states he is feeling much better. I will continue current antimicrobial therapy pending the results of today's bronchoscopy.   PLAN: Continue current a microbial therapy. I will follow up in the morning.   Cliffton Asters, MD [facility] [phone] pager   [phone] cell 09/15/2014, 3:59 PM

## 2014-09-15 NOTE — Progress Notes (Signed)
Dr Sunnie Nielsen notified of patients b/p of 86/50, new orders given. Will continue to monitor.

## 2014-09-15 NOTE — Progress Notes (Signed)
   Name: Henry Mitchell MRN: [id number] DOB: [birth year]    ADMISSION DATE:  09/10/2014 CONSULTATION DATE:  09/15/2014  REFERRING MD :  Drue Second -ID  CHIEF COMPLAINT:  Pneumonia   HISTORY OF PRESENT ILLNESS:  29 year old nonsmoker from [place] who [place] [facility] jail for the past 2 weeks. He has 2 officers at the bedside and he is in chains. He was transferred with complaints of fevers, cough productive of clear sputum and shortness of breath for 3 days. He also reported night sweats. Chest x-ray showed bilateral infiltrates cystoscopy for multifocal pneumonia. Subsequent testing was positive for HIV with CD4 count of 10. He was noted to have thrush-apparently his skin test for tuberculosis was negative on entry to jail. He is being retested now. He denies history of IV drug use or multiple sexual contacts.  He denies history suggestive of previous opportunistic infections such as pneumonia. We are asked by infectious disease to perform a bronchoscopy since he is not producing much sputum He does appear to be afebrile and denies dyspnea   SUBJECTIVE: afebrile Denies CP, dyspnea  VITAL SIGNS: Temp:  [98.2 F (36.8 C)-99.5 F (37.5 C)] 99.5 F (37.5 C) (09/01 0709) Pulse Rate:  [87-102] 91 (09/01 0526) Resp:  [18-26] 24 (09/01 0800) BP: (93-123)/(56-78) 120/70 mmHg (09/01 0800) SpO2:  [98 %-100 %] 100 % (09/01 0800) Weight:  [162 lb (73.483 kg)] 162 lb (73.483 kg) (09/01 0709)  PHYSICAL EXAMINATION: Gen. Pleasant, well-nourished, in no distress, anxious affect ENT - no lesions, no post nasal drip Neck: No JVD, no thyromegaly, no carotid bruits Lungs: no use of accessory muscles, no dullness to percussion, fine crackles right base , no rhonchi  Cardiovascular: Rhythm regular, heart sounds  normal, no murmurs, no peripheral edema Abdomen: soft and non-tender, no hepatosplenomegaly, BS normal. Musculoskeletal: No deformities, no cyanosis or clubbing Neuro:  alert, non  focal Skin:  Warm, no lesions/ rash    Recent Labs Lab 09/11/14 0444 09/14/14 0400 09/15/14 0427  NA 134* 134* 136  K 4.4 4.4 4.0  CL 106 103 103  CO2 BUN CREATININE 0.82 0.98 0.90  GLUCOSE 98 103* 104*    Recent Labs Lab 09/11/14 0444 09/14/14 0400 09/15/14 0427  HGB 12.1* 11.8* 12.7*  HCT 36.6* 36.0* 38.9*  WBC 10.1 9.7 7.2  PLT 327 372 394   No results found.  ASSESSMENT / PLAN:  Community-acquired pneumonia- history of incarceration does raise the possibility of tuberculosis, but he appears to have been in jail only for 2 weeks. 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 Sputum specimens have been difficult to obtain. There is also some risk to releasing him to the jail environment without clear diagnosis  Recommend- Bronchoscopy today -Can discharge once prelim results obtained   [facility] V.  MD [phone]    09/15/2014, 8:19 AM

## 2014-09-15 NOTE — Progress Notes (Signed)
PROGRESS NOTE  Henry Mitchell WUJ:[id number] DOB: 02/14/1985 DOA: 09/10/2014 PCP: No primary care provider on file.  HPI 29 year old African-American male admitted from jail on the early morning of 8/27 for multilobar pneumonia and oral thrush. Patient started on antibiotics and Diflucan. Marland Kitchen HIV testing came back positive, new diagnosis for patient.  denies dyspnea. No pain, sleepy post bronchoscopy.   Assessment/Plan: HIV,  Continue with  Bactrim, Zithromax.virals. ptient is at increased risk for TB, although low likelihood. PPD negative. ID recommending bronchoscopy. Bronchoscopy 9-1.   PNA, possible PCP : on Bactrim for PCP PNA> levaquin discontinue by ID [id number] Oral candidiasis: Continue fluconazole S/P bronchoscopy 9-01. Awaiting culture results. AFB culture, Pneumocystis smear smear. Cytology pending.   Hypotension;  IV bolus, 1 L.  check lactic acid.  Monitor closely.   Anemia: Mild.  Hyponatremia; continue with IV fluids.  Given concerns and risk for TB  Code Status: Full code  Family Communication: Unable to contact family due to prison rules  Disposition Plan: Anticipate discharge when results available fom ronchosocopy.    Consultants:  Infectious disease  Pulmonary   Procedures Planned bronchoscopy  Antibiotics:  Levaquin 8/27-present (continue until 9/2 )   fluconazole 8/27-present  Bactrim DS daily indefinite  Zithromax start once Levaquin finished   Objective: BP 78/48 mmHg  Pulse 110  Temp(Src) 99.3 F (37.4 C) (Oral)  Resp 20  Ht 5\' 7"  (1.702 m)  Wt 73.483 kg (162 lb)  BMI 25.37 kg/m2  SpO2 98%  Intake/Output Summary (Last 24 hours) at 09/15/14 1505 Last data filed at 09/15/14 0526  Gross per 24 hour  Intake   1050 ml  Output      0 ml  Net   1050 ml   Filed Weights   09/10/14 0900 09/11/14 0444 09/15/14 0709  Weight: 74.2 kg (163 lb 9.3 oz) 73.6 kg (162 lb 4.1 oz) 73.483 kg (162 lb)    Exam:   General:  Sedated, but  wake up and answer questions.   HEENT: Oral thrush noted  Cardiovascular: Regular rate and rhythm, S1-S2  Respiratory: Clear to auscultation bilaterally  Abdomen: Soft nontender, nondistended, positive bowel sounds  Musculoskeletal: No clubbing or cyanosis or edema   Data Reviewed: Basic Metabolic Panel:  Recent Labs Lab 09/10/14 0225 09/11/14 0444 09/14/14 0400 09/15/14 0427  NA 132* 134* 134* 136  K 3.7 4.4 4.4 4.0  CL 100* 106 103 103  CO2 21* 23 23 23   GLUCOSE 103* 98 103* 104*  BUN 12 8 10 9   CREATININE 0.80 0.82 0.98 0.90  CALCIUM 8.9 8.8* 8.9 9.2   Liver Function Tests:  Recent Labs Lab 09/14/14 0400  AST 32  ALT 27  ALKPHOS 47  BILITOT 0.4  PROT 7.0  ALBUMIN 3.0*   No results for input(s): LIPASE, AMYLASE in the last 168 hours. No results for input(s): AMMONIA in the last 168 hours. CBC:  Recent Labs Lab 09/10/14 0225 09/11/14 0444 09/14/14 0400 09/15/14 0427  WBC 9.5 10.1 9.7 7.2  NEUTROABS  --   --  7.6  --   HGB 11.7* 12.1* 11.8* 12.7*  HCT 34.7* 36.6* 36.0* 38.9*  MCV 81.5 81.9 82.0 82.4  PLT 302 327 372 394   Cardiac Enzymes:   No results for input(s): CKTOTAL, CKMB, CKMBINDEX, TROPONINI in the last 168 hours. BNP (last 3 results) No results for input(s): BNP in the last 8760 hours.  ProBNP (last 3 results) No results for input(s): PROBNP in the last 8760 hours.  CBG: No results for input(s): GLUCAP in the last 168 hours.  Recent Results (from the past 240 hour(s))  Rapid strep screen (not at [facility])     Status: None   Collection Time: 09/10/14  1:21 AM  Result Value Ref Range Status   Streptococcus, Group A Screen (Direct) NEGATIVE NEGATIVE Final    Comment: (NOTE) A Rapid Antigen test may result negative if the antigen level in the sample is below the detection level of this test. The FDA has not cleared this test as a stand-alone test therefore the rapid antigen negative result has reflexed to a Group A Strep culture.     Culture, Group A Strep     Status: None   Collection Time: 09/10/14  1:21 AM  Result Value Ref Range Status   Strep A Culture Negative  Final    Comment: (NOTE) Performed At: [facility] [address] [place], [place] [id number] Mila Homer MD WU:[id number]   Culture, blood (routine x 2)     Status: None   Collection Time: 09/10/14  2:30 AM  Result Value Ref Range Status   Specimen Description BLOOD RIGHT FOREARM  Final   Special Requests BOTTLES DRAWN AEROBIC AND ANAEROBIC  Final   Culture   Final    NO GROWTH 5 DAYS Performed at [facility]    Report Status 09/15/2014 FINAL  Final  Culture, blood (routine x 2)     Status: None   Collection Time: 09/10/14  2:32 AM  Result Value Ref Range Status   Specimen Description BLOOD LEFT ANTECUBITAL  Final   Special Requests BOTTLES DRAWN AEROBIC AND ANAEROBIC  Final   Culture   Final    NO GROWTH 5 DAYS Performed at [facility]    Report Status 09/15/2014 FINAL  Final  MRSA PCR Screening     Status: None   Collection Time: 09/10/14  6:33 AM  Result Value Ref Range Status   MRSA by PCR NEGATIVE NEGATIVE Final    Comment:        The GeneXpert MRSA Assay (FDA approved for NASAL specimens only), is one component of a comprehensive MRSA colonization surveillance program. It is not intended to diagnose MRSA infection nor to guide or monitor treatment for MRSA infections.      Studies: Dg Chest Port 1 View  09/15/2014   CLINICAL DATA:  Status post bronchoscopy with right lower lobe biopsy  EXAM: PORTABLE CHEST - 1 VIEW  COMPARISON:  09/10/2014 chest radiograph.  FINDINGS: Stable cardiomediastinal silhouette with normal heart size. No pneumothorax. No pleural effusion. There is worsening extensive patchy consolidation throughout the central lungs bilaterally, most prominent in the parahilar right upper lung.  IMPRESSION: 1. No pneumothorax. 2. Worsening extensive patchy consolidation throughout  the bilateral central lungs, most prominent in the right upper parahilar lung. Differential includes multifocal pneumonia or alveolar hemorrhage.   Electronically Signed   By: Delbert Phenix M.D.   On: 09/15/2014 09:46   Dg C-arm Bronchoscopy  09/15/2014   CLINICAL DATA:    C-ARM BRONCHOSCOPY  Fluoroscopy was utilized by the requesting physician.  No radiographic  interpretation.     Scheduled Meds: . antiseptic oral rinse  7 mL Mouth Rinse BID  . [START ON 09/17/2014] azithromycin  1,200 mg Oral Weekly  . elvitegravir-cobicistat-emtricitabine-tenofovir  1 tablet Oral Q breakfast  . enoxaparin (LOVENOX) injection  40 mg Subcutaneous Q24H  . fentaNYL (SUBLIMAZE) injection  25-100 mcg Intravenous Once  .  fluconazole  200 mg Oral Daily  . midazolam  1-4 mg Intravenous Once  . sulfamethoxazole-trimethoprim  2 tablet Oral TID    Continuous Infusions: . sodium chloride 100 mL/hr at 09/15/14 1230     Time spent: 25 minutes  Kymari Lollis A  [facility] Pager [phone], please contact night-coverage at [email or web address], password [facility] 09/15/2014, 3:05 PM  LOS: 5 days

## 2014-09-16 ENCOUNTER — Encounter (HOSPITAL_COMMUNITY): Payer: Self-pay | Admitting: Pulmonary Disease

## 2014-09-16 DIAGNOSIS — B20 Human immunodeficiency virus [HIV] disease: Secondary | ICD-10-CM

## 2014-09-16 DIAGNOSIS — B59 Pneumocystosis: Secondary | ICD-10-CM

## 2014-09-16 MED ORDER — SODIUM CHLORIDE 0.9 % IV BOLUS (SEPSIS)
500.0000 mL | Freq: Once | INTRAVENOUS | Status: AC
  Administered 2014-09-16: 500 mL via INTRAVENOUS

## 2014-09-16 MED ORDER — FLUCONAZOLE 200 MG PO TABS: 200.0000 mg | ORAL_TABLET | Freq: Every day | ORAL | Status: AC

## 2014-09-16 MED ORDER — DOLUTEGRAVIR SODIUM 50 MG PO TABS: 50.0000 mg | ORAL_TABLET | Freq: Every day | ORAL | Status: AC

## 2014-09-16 MED ORDER — EMTRICITABINE-TENOFOVIR DF 200-300 MG PO TABS
1.0000 | ORAL_TABLET | Freq: Every day | ORAL | Status: DC
Start: 2014-09-16 — End: 2014-09-16
  Administered 2014-09-16: 1 via ORAL
  Filled 2014-09-16: qty 1

## 2014-09-16 MED ORDER — AZITHROMYCIN 600 MG PO TABS: 1200.0000 mg | ORAL_TABLET | ORAL | Status: AC

## 2014-09-16 MED ORDER — SULFAMETHOXAZOLE-TRIMETHOPRIM 800-160 MG PO TABS: 2.0000 | ORAL_TABLET | Freq: Three times a day (TID) | ORAL | Status: AC

## 2014-09-16 MED ORDER — DOLUTEGRAVIR SODIUM 50 MG PO TABS
50.0000 mg | ORAL_TABLET | Freq: Every day | ORAL | Status: DC
  Administered 2014-09-16: 50 mg via ORAL
  Filled 2014-09-16: qty 1

## 2014-09-16 MED ORDER — EMTRICITABINE-TENOFOVIR DF 200-300 MG PO TABS: 1.0000 | ORAL_TABLET | Freq: Every day | ORAL | Status: AC

## 2014-09-16 NOTE — Progress Notes (Signed)
Patient ID: Christell Constant, male   DOB: [birth year], 29 y.o.   MRN: [id number]         [facility]    Date of Admission:  09/10/2014           Day 3 trimethoprim sulfamethoxazole          Patient Active Problem List   Diagnosis Date Noted  . HIV disease 09/11/2014    Priority: High  . 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 Oral candidiasis 09/10/2014  . Anemia 09/10/2014  . Hyponatremia 09/10/2014    . antiseptic oral rinse  7 mL Mouth Rinse BID  . [START ON 09/17/2014] azithromycin  1,200 mg Oral Weekly  . elvitegravir-cobicistat-emtricitabine-tenofovir  1 tablet Oral Q breakfast  . fentaNYL (SUBLIMAZE) injection  25-100 mcg Intravenous Once  . fluconazole  200 mg Oral Daily  . midazolam  1-4 mg Intravenous Once  . sulfamethoxazole-trimethoprim  2 tablet Oral TID    SUBJECTIVE: He has minimal dry cough. He denies any shortness of breath. He is not having any pain on swallowing but he does have difficulty swallowing some pills, especially if they cannot be crushed. As a result he has been refusing to take Genvoya. He states that his appetite is great. He believes he will not be in jail very much longer. He plans on moving to [place], [place] as soon as he is released.  Review of Systems: Pertinent items are noted in HPI.  Past Medical History  Diagnosis Date  . Asthma     Social History  Substance Use Topics  . Smoking status: Never Smoker   . Smokeless tobacco: Never Used  . Alcohol Use: No    History reviewed. No pertinent family history. No Known Allergies  OBJECTIVE: Filed Vitals:   09/15/14 2130 09/16/14 0213 09/16/14 0535 09/16/14 0953  BP:  106/58 95/48 107/67  Pulse: 94 87 89 104  Temp:   98.6 F (37 C) 99.6 F (37.6 C)  TempSrc:   Oral Oral  Resp:  Height:      Weight:      SpO2:  100% 100% 100%   Body mass index is 25.37 kg/(m^2).  General: Smiling and in good spirits    Skin: No rash  Oral: No thrush noted  Lungs: Clear  Cor: Regular S1 and S2 with no murmurs  Abdomen: Soft and nontender  Lab Results Lab Results  Component Value Date   WBC 6.8 09/15/2014   HGB 11.3* 09/15/2014   HCT 34.5* 09/15/2014   MCV 81.8 09/15/2014   PLT 351 09/15/2014    Lab Results  Component Value Date   CREATININE 0.90 09/15/2014   BUN 9 09/15/2014   NA 136 09/15/2014   K 4.0 09/15/2014   CL 103 09/15/2014   CO2 23 09/15/2014    Lab Results  Component Value Date   ALT 27 09/14/2014   AST 32 09/14/2014   ALKPHOS 47 09/14/2014   BILITOT 0.4 09/14/2014     Microbiology: Recent Results (from the past 240 hour(s))  Rapid strep screen (not at [facility])     Status: None   Collection Time: 09/10/14  1:21 AM  Result Value Ref Range Status   Streptococcus, Group A Screen (Direct) NEGATIVE NEGATIVE Final    Comment: (NOTE) A Rapid Antigen test may result negative if the antigen level in the sample is below the detection level of this test. The FDA has  not cleared this test as a stand-alone test therefore the rapid antigen negative result has reflexed to a Group A Strep culture.   Culture, Group A Strep     Status: None   Collection Time: 09/10/14  1:21 AM  Result Value Ref Range Status   Strep A Culture Negative  Final    Comment: (NOTE) Performed At: [facility] [address] [place], [place] [id number] Mila Homer MD WU:[id number]   Culture, blood (routine x 2)     Status: None   Collection Time: 09/10/14  2:30 AM  Result Value Ref Range Status   Specimen Description BLOOD RIGHT FOREARM  Final   Special Requests BOTTLES DRAWN AEROBIC AND ANAEROBIC  Final   Culture   Final    NO GROWTH 5 DAYS Performed at [facility]    Report Status 09/15/2014 FINAL  Final  Culture, blood (routine x 2)     Status: None   Collection Time: 09/10/14  2:32 AM  Result Value Ref Range Status   Specimen Description BLOOD LEFT ANTECUBITAL  Final    Special Requests BOTTLES DRAWN AEROBIC AND ANAEROBIC  Final   Culture   Final    NO GROWTH 5 DAYS Performed at [facility]    Report Status 09/15/2014 FINAL  Final  MRSA PCR Screening     Status: None   Collection Time: 09/10/14  6:33 AM  Result Value Ref Range Status   MRSA by PCR NEGATIVE NEGATIVE Final    Comment:        The GeneXpert MRSA Assay (FDA approved for NASAL specimens only), is one component of a comprehensive MRSA colonization surveillance program. It is not intended to diagnose MRSA infection nor to guide or monitor treatment for MRSA infections.   Culture, bal-quantitative     Status: None (Preliminary result)   Collection Time: 09/15/14  8:45 AM  Result Value Ref Range Status   Specimen Description BRONCHIAL ALVEOLAR LAVAGE  Final   Special Requests Immunocompromised  Final   Gram Stain   Final    NO WBC SEEN NO SQUAMOUS EPITHELIAL CELLS SEEN NO ORGANISMS SEEN Performed at [facility]    Culture PENDING  Incomplete   Report Status PENDING  Incomplete  Fungus Culture with Smear     Status: None (Preliminary result)   Collection Time: 09/15/14  8:45 AM  Result Value Ref Range Status   Specimen Description BRONCHIAL ALVEOLAR LAVAGE  Final   Special Requests Immunocompromised  Final   Fungal Smear   Final    NO YEAST OR FUNGAL ELEMENTS SEEN Performed at [facility]    Culture   Final    CULTURE IN PROGRESS FOR FOUR WEEKS Performed at [facility]    Report Status PENDING  Incomplete  Pneumocystis smear by DFA     Status: None   Collection Time: 09/15/14  8:45 AM  Result Value Ref Range Status   Specimen Source-PJSRC BRONCHIAL ALVEOLAR LAVAGE  Final   Pneumocystis jiroveci Ag POSITIVE  Final    Comment: Performed at [facility]     ASSESSMENT: He is responding well to treatment for pneumocystis pneumonia. He needs a total of 3 weeks of full dose trimethoprim sulfamethoxazole before switching to one  tablet daily. Since he is not tolerating Genvoya I would switch him to Truvada and Tivicay, both of which can be crushed. I encouraged him to find a physician in [place] as soon as he  can.  PLAN: 1. Continue trimethoprim sulfamethoxazole 2 double strength tablets 3 times daily for 18 more days then switch to one double strength tablet daily 2. Truvada 1 tablet daily along with Tivicay 50 mg daily 3. Fluconazole 100 mg weekly 4. Azithromycin 1200 mg weekly 5. As far as I am concerned he can be discharged today 6. Discontinue airborne precautions 7. I will sign off now but please call if we can be of further assistance while he is here  Cliffton Asters, MD [facility] [phone] pager   [phone] cell 09/16/2014, 12:25 PM

## 2014-09-16 NOTE — Progress Notes (Signed)
Name: Henry Mitchell MRN: [id number] DOB: [birth year]    ADMISSION DATE:  09/10/2014 CONSULTATION DATE:  09/16/2014  REFERRING MD :  Drue Second -ID  CHIEF COMPLAINT:  Pneumonia   HISTORY OF PRESENT ILLNESS:  29 year old nonsmoker from [place] who is incarcerated in the [facility] jail for the past 2 weeks. He has 2 officers at the bedside and he is in chains. He was transferred with complaints of fevers, cough productive of clear sputum and shortness of breath for 3 days. He also reported night sweats. Chest x-ray showed bilateral infiltrates cystoscopy for multifocal pneumonia. Subsequent testing was positive for HIV with CD4 count of 10. He was noted to have thrush-apparently his skin test for tuberculosis was negative on entry to jail. He is being retested now. He denies history of IV drug use or multiple sexual contacts.  He denies history suggestive of previous opportunistic infections such as pneumonia. We are asked by infectious disease to perform a bronchoscopy since he is not producing much sputum He does appear to be afebrile and denies dyspnea   SUBJECTIVE: afebrile Denies CP, dyspnea  VITAL SIGNS: Temp:  [97.7 F (36.5 C)-100.6 F (38.1 C)] 99.6 F (37.6 C) (09/02 0953) Pulse Rate:  [85-115] 104 (09/02 0953) Resp:  [20-24] 20 (09/02 0953) BP: (78-107)/(48-67) 107/67 mmHg (09/02 0953) SpO2:  [95 %-100 %] 100 % (09/02 0953)  PHYSICAL EXAMINATION: Gen. Pleasant, well-nourished, in no distress, anxious affect ENT - no lesions, no post nasal drip Neck: No JVD, no thyromegaly, no carotid bruits Lungs: no use of accessory muscles, no dullness to percussion, fine crackles right base , no rhonchi  Cardiovascular: Rhythm regular, heart sounds  normal, no murmurs, no peripheral edema Abdomen: soft and non-tender, no hepatosplenomegaly, BS normal. Musculoskeletal: No deformities, no cyanosis or clubbing Neuro:  alert, non focal Skin:  Warm, no lesions/ rash    Recent Labs Lab  09/11/14 0444 09/14/14 0400 09/15/14 0427  NA 134* 134* 136  K 4.4 4.4 4.0  CL 106 103 103  CO2 23 23 23   BUN 8 10 9   CREATININE 0.82 0.98 0.90  GLUCOSE 98 103* 104*    Recent Labs Lab 09/14/14 0400 09/15/14 0427 09/15/14 1645  HGB 11.8* 12.7* 11.3*  HCT 36.0* 38.9* 34.5*  WBC 9.7 7.2 6.8  PLT 372 394 351   Dg Chest Port 1 View  09/15/2014   CLINICAL DATA:  Status post bronchoscopy with right lower lobe biopsy  EXAM: PORTABLE CHEST - 1 VIEW  COMPARISON:  09/10/2014 chest radiograph.  FINDINGS: Stable cardiomediastinal silhouette with normal heart size. No pneumothorax. No pleural effusion. There is worsening extensive patchy consolidation throughout the central lungs bilaterally, most prominent in the parahilar right upper lung.  IMPRESSION: 1. No pneumothorax. 2. Worsening extensive patchy consolidation throughout the bilateral central lungs, most prominent in the right upper parahilar lung. Differential includes multifocal pneumonia or alveolar hemorrhage.   Electronically Signed   By: Delbert Phenix M.D.   On: 09/15/2014 09:46   Dg C-arm Bronchoscopy  09/15/2014   CLINICAL DATA:    C-ARM BRONCHOSCOPY  Fluoroscopy was utilized by the requesting physician.  No radiographic  interpretation.     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 There is also some risk to releasing him to the jail environment without clear diagnosis  Recommend- Bactrim per ID  No need for steroids since not hypoxic  PCCM to sign off   Shaylen Nephew V.  MD [phone]    09/16/2014, 10:11 AM

## 2014-09-16 NOTE — Progress Notes (Signed)
Pt discharged back to jail in stable condition. Discharge instructions and scripts given patient. Pt verbalized understanding. Scripts given to officers.

## 2014-09-16 NOTE — Discharge Summary (Signed)
Physician Discharge Summary  Henry Mitchell PTW:[id number] DOB: 1985/05/27 DOA: 09/10/2014  PCP: No primary care provider on file.  Admit date: 09/10/2014 Discharge date: 09/16/2014  Time spent: 35 minutes  Recommendations for Outpatient Follow-up:  Needs to follow up with ID Dr.  Need B-met to follow renal function.   Discharge Diagnoses:    HIV disease   Pneumocystis jiroveci pneumonia   Oral candidiasis   Anemia   Hyponatremia   Discharge Condition: Stable  Diet recommendation: regular diet.   Filed Weights   09/10/14 0900 09/11/14 0444 09/15/14 0709  Weight: 74.2 kg (163 lb 9.3 oz) 73.6 kg (162 lb 4.1 oz) 73.483 kg (162 lb)    History of present illness:  Henry Mitchell is a 29 y.o. male who was brought to the ED from the [facility] (with 2 officers at bedside) with complaints of fevers and chills cough and SOB x 3 days. He reports having night sweats. He was found to have multifocal pneumonia on Chest X-ray and was placed on empiric antibiotic coverage of IV Levaquin and a dose of Oral Bactrim and referred for admission. Of Note he was admitted with Pneumonia 3 months ago.   Hospital Course:  29 year old African-American male [facility] jail [facility] the early morning of 8/27 for multilobar pneumonia and oral thrush. Patient started on antibiotics and Diflucan. Marland Kitchen HIV testing came back positive, new diagnosis for patient. denies dyspnea. No pain, sleepy post bronchoscopy.   Assessment/Plan: HIV, Continue with Bactrim DS 2 tablet  TID for 18 more days to treat PCP PNA, then switch to one double strength tablet daily . , Zithromax Weekly . PPD negative. Discharge on truvada and Tivicay.  S/P bronchoscopy 9-01. AFB culture no acid fast bacilli, Pneumocystis smear smear positive. Cytology pending.  Patient agree to releases his medical information to Dr at jail.  PCP PNA : on Bactrim for PCP PNA. Needs 18 more days for treatment of PNA, then need one double strength  tablet daily for prophylaxis.   Oral candidiasis: Continue fluconazole, need fluconazole for 10 more days, after that need fluconazole weekly.    Hypotension;  Resolved with IV fluids. Suspect related to sedatives post procedure.   Anemia: Mild.  Hyponatremia; treated IV fluids.  Given concerns and risk for TB  Procedures:  Bronchoscopy 9-01  Consultations:  ID  Pulmonary  Discharge Exam: Filed Vitals:   09/16/14 1358  BP: 112/75  Pulse: 101  Temp: 99 F (37.2 C)  Resp: 18    General: NAD Cardiovascular: S 1, S 2 RRR Respiratory: CTA  Discharge Instructions   Discharge Instructions    Diet general    Complete by:  As directed      Increase activity slowly    Complete by:  As directed           Current Discharge Medication List    START taking these medications   Details  azithromycin (ZITHROMAX) 600 MG tablet Take 2 tablets (1,200 mg total) by mouth once a week. Qty: 60 tablet, Refills: 0    dolutegravir (TIVICAY) 50 MG tablet Take 1 tablet (50 mg total) by mouth daily. Qty: 30 tablet, Refills: 0    emtricitabine-tenofovir (TRUVADA) 200-300 MG per tablet Take 1 tablet by mouth daily. Qty: 30 tablet, Refills: 0    fluconazole (DIFLUCAN) 200 MG tablet Take 1 tablet (200 mg total) by mouth daily. Qty: 10 tablet, Refills: 0    sulfamethoxazole-trimethoprim (BACTRIM DS,SEPTRA DS) 800-160 MG per tablet Take 2 tablets by  mouth 3 (three) times daily. Qty: 108 tablet, Refills: 0      CONTINUE these medications which have NOT CHANGED   Details  albuterol (PROVENTIL HFA;VENTOLIN HFA) 108 (90 BASE) MCG/ACT inhaler Inhale 2 puffs into the lungs every 4 (four) hours as needed for wheezing or shortness of breath.       No Known Allergies Follow-up Information    Please follow up.   Why:  needs to follow up with PCP, infectious diseases Dr.        Marcelline Deist results of significant diagnostics from this hospitalization (including imaging, microbiology,  ancillary and laboratory) are listed below for reference.    Significant Diagnostic Studies: Dg Chest 2 View  09/10/2014   CLINICAL DATA:  Cough and fever.  EXAM: CHEST  2 VIEW  COMPARISON:  None.  FINDINGS: Right upper lobe patchy consolidation, moderate in degree. Minimal patchy opacity in the left suprahilar and infrahilar lung. The heart size and mediastinal contours are normal. Pulmonary vasculature is normal. No pleural effusion or pneumothorax. No osseous abnormality.  IMPRESSION: Multifocal pneumonia, most significant in the right upper lobe.   Electronically Signed   By: Jeb Levering M.D.   On: 09/10/2014 02:16   Dg Chest Port 1 View  09/15/2014   CLINICAL DATA:  Status post bronchoscopy with right lower lobe biopsy  EXAM: PORTABLE CHEST - 1 VIEW  COMPARISON:  09/10/2014 chest radiograph.  FINDINGS: Stable cardiomediastinal silhouette with normal heart size. No pneumothorax. No pleural effusion. There is worsening extensive patchy consolidation throughout the central lungs bilaterally, most prominent in the parahilar right upper lung.  IMPRESSION: 1. No pneumothorax. 2. Worsening extensive patchy consolidation throughout the bilateral central lungs, most prominent in the right upper parahilar lung. Differential includes multifocal pneumonia or alveolar hemorrhage.   Electronically Signed   By: Ilona Sorrel M.D.   On: 09/15/2014 09:46   Dg C-arm Bronchoscopy  09/15/2014   CLINICAL DATA:    C-ARM BRONCHOSCOPY  Fluoroscopy was utilized by the requesting physician.  No radiographic  interpretation.     Microbiology: Recent Results (from the past 240 hour(s))  Rapid strep screen (not at [facility])     Status: None   Collection Time: 09/10/14  1:21 AM  Result Value Ref Range Status   Streptococcus, Group A Screen (Direct) NEGATIVE NEGATIVE Final    Comment: (NOTE) A Rapid Antigen test may result negative if the antigen level in the sample is below the detection level of this test. The FDA has  not cleared this test as a stand-alone test therefore the rapid antigen negative result has reflexed to a Group A Strep culture.   Culture, Group A Strep     Status: None   Collection Time: 09/10/14  1:21 AM  Result Value Ref Range Status   Strep A Culture Negative  Final    Comment: (NOTE) Performed At: [facility] [address] [place], [place] [id number] Lindon Romp MD QJ:[id number]   Culture, blood (routine x 2)     Status: None   Collection Time: 09/10/14  2:30 AM  Result Value Ref Range Status   Specimen Description BLOOD RIGHT FOREARM  Final   Special Requests BOTTLES DRAWN AEROBIC AND ANAEROBIC 5ML  Final   Culture   Final    NO GROWTH 5 DAYS Performed at [facility]    Report Status 09/15/2014 FINAL  Final  Culture, blood (routine x 2)     Status: None   Collection Time: 09/10/14  2:32 AM  Result Value Ref Range Status   Specimen Description BLOOD LEFT ANTECUBITAL  Final   Special Requests BOTTLES DRAWN AEROBIC AND ANAEROBIC 5ML  Final   Culture   Final    NO GROWTH 5 DAYS Performed at [facility]    Report Status 09/15/2014 FINAL  Final  MRSA PCR Screening     Status: None   Collection Time: 09/10/14  6:33 AM  Result Value Ref Range Status   MRSA by PCR NEGATIVE NEGATIVE Final    Comment:        The GeneXpert MRSA Assay (FDA approved for NASAL specimens only), is one component of a comprehensive MRSA colonization surveillance program. It is not intended to diagnose MRSA infection nor to guide or monitor treatment for MRSA infections.   AFB culture with smear     Status: None (Preliminary result)   Collection Time: 09/15/14  8:45 AM  Result Value Ref Range Status   Specimen Description BRONCHIAL ALVEOLAR LAVAGE  Final   Special Requests Immunocompromised  Final   Acid Fast Smear   Final    NO ACID FAST BACILLI SEEN Performed at [facility]    Culture   Final    CULTURE WILL BE EXAMINED FOR 6 WEEKS BEFORE  ISSUING A FINAL REPORT Performed at [facility]    Report Status PENDING  Incomplete  Culture, bal-quantitative     Status: None (Preliminary result)   Collection Time: 09/15/14  8:45 AM  Result Value Ref Range Status   Specimen Description BRONCHIAL ALVEOLAR LAVAGE  Final   Special Requests Immunocompromised  Final   Gram Stain   Final    NO WBC SEEN NO SQUAMOUS EPITHELIAL CELLS SEEN NO ORGANISMS SEEN Performed at [facility]    Culture PENDING  Incomplete   Report Status PENDING  Incomplete  Fungus Culture with Smear     Status: None (Preliminary result)   Collection Time: 09/15/14  8:45 AM  Result Value Ref Range Status   Specimen Description BRONCHIAL ALVEOLAR LAVAGE  Final   Special Requests Immunocompromised  Final   Fungal Smear   Final    NO YEAST OR FUNGAL ELEMENTS SEEN Performed at [facility]    Culture   Final    CULTURE IN PROGRESS FOR FOUR WEEKS Performed at [facility]    Report Status PENDING  Incomplete  Pneumocystis smear by DFA     Status: None   Collection Time: 09/15/14  8:45 AM  Result Value Ref Range Status   Specimen Source-[facility] BRONCHIAL ALVEOLAR LAVAGE  Final   Pneumocystis jiroveci Ag POSITIVE  Final    Comment: Performed at [place]: Basic Metabolic Panel:  Recent Labs Lab 09/10/14 0225 09/11/14 0444 09/14/14 0400 09/15/14 0427  NA 132* 134* 134* 136  K 3.7 4.4 4.4 4.0  CL 100* 106 103 103  CO2 21* _0 GLUCOSE 103* 98 103* 104*  BUN _1 CREATININE 0.80 0.82 0.98 0.90  CALCIUM 8.9 8.8* 8.9 9.2   Liver Function Tests:  Recent Labs Lab 09/14/14 0400  AST 32  ALT 27  ALKPHOS 47  BILITOT 0.4  PROT 7.0  ALBUMIN 3.0*   No results for input(s): LIPASE, AMYLASE in the last 168 hours. No results for input(s): AMMONIA in the last 168 hours. CBC:  Recent Labs Lab 09/10/14 0225 09/11/14 0444 09/14/14 0400 09/15/14 0427 09/15/14 1645  WBC 9.5  10.1 9.7 7.2  6.8  NEUTROABS  --   --  7.6  --   --   HGB 11.7* 12.1* 11.8* 12.7* 11.3*  HCT 34.7* 36.6* 36.0* 38.9* 34.5*  MCV 81.5 81.9 82.0 82.4 81.8  PLT 302 327 372 394 351   Cardiac Enzymes: No results for input(s): CKTOTAL, CKMB, CKMBINDEX, TROPONINI in the last 168 hours. BNP: BNP (last 3 results) No results for input(s): BNP in the last 8760 hours.  ProBNP (last 3 results) No results for input(s): PROBNP in the last 8760 hours.  CBG: No results for input(s): GLUCAP in the last 168 hours.     SignedNiel Hummer A  [facility] 09/16/2014, 2:06 PM

## 2014-09-18 LAB — CULTURE, BAL-QUANTITATIVE W GRAM STAIN
Colony Count: >=100000
Gram Stain: NONE SEEN

## 2014-09-18 LAB — CULTURE, BAL-QUANTITATIVE

## 2014-09-22 LAB — HIV-1 INTEGRASE GENOTYPE
Date Viral Load Collected: NO GROWTH
VALUE LAST VIRAL LOAD: NO GROWTH

## 2014-09-28 LAB — HIV-1 RNA ULTRAQUANT REFLEX TO GENTYP+
HIV-1 RNA BY PCR: 17640 {copies}/mL
HIV-1 RNA QUANT, LOG: 4.246 {Log_copies}/mL

## 2014-09-28 LAB — REFLEX TO GENOSURE(R) MG: HIV GENOSURE(R) MG PDF: 0

## 2014-10-14 LAB — FUNGUS CULTURE W SMEAR: FUNGAL SMEAR: NONE SEEN

## 2014-10-28 LAB — AFB CULTURE WITH SMEAR (NOT AT ARMC): Acid Fast Smear: NONE SEEN

## 2017-02-17 IMAGING — CR DG CHEST 1V PORT
1 series · 1 of 1 positions shown · non-contrast
Comparison: 09/10/2014 chest radiograph.

CLINICAL DATA: Status post bronchoscopy with right lower lobe
biopsy

EXAM:
PORTABLE CHEST - 1 VIEW

[AP]
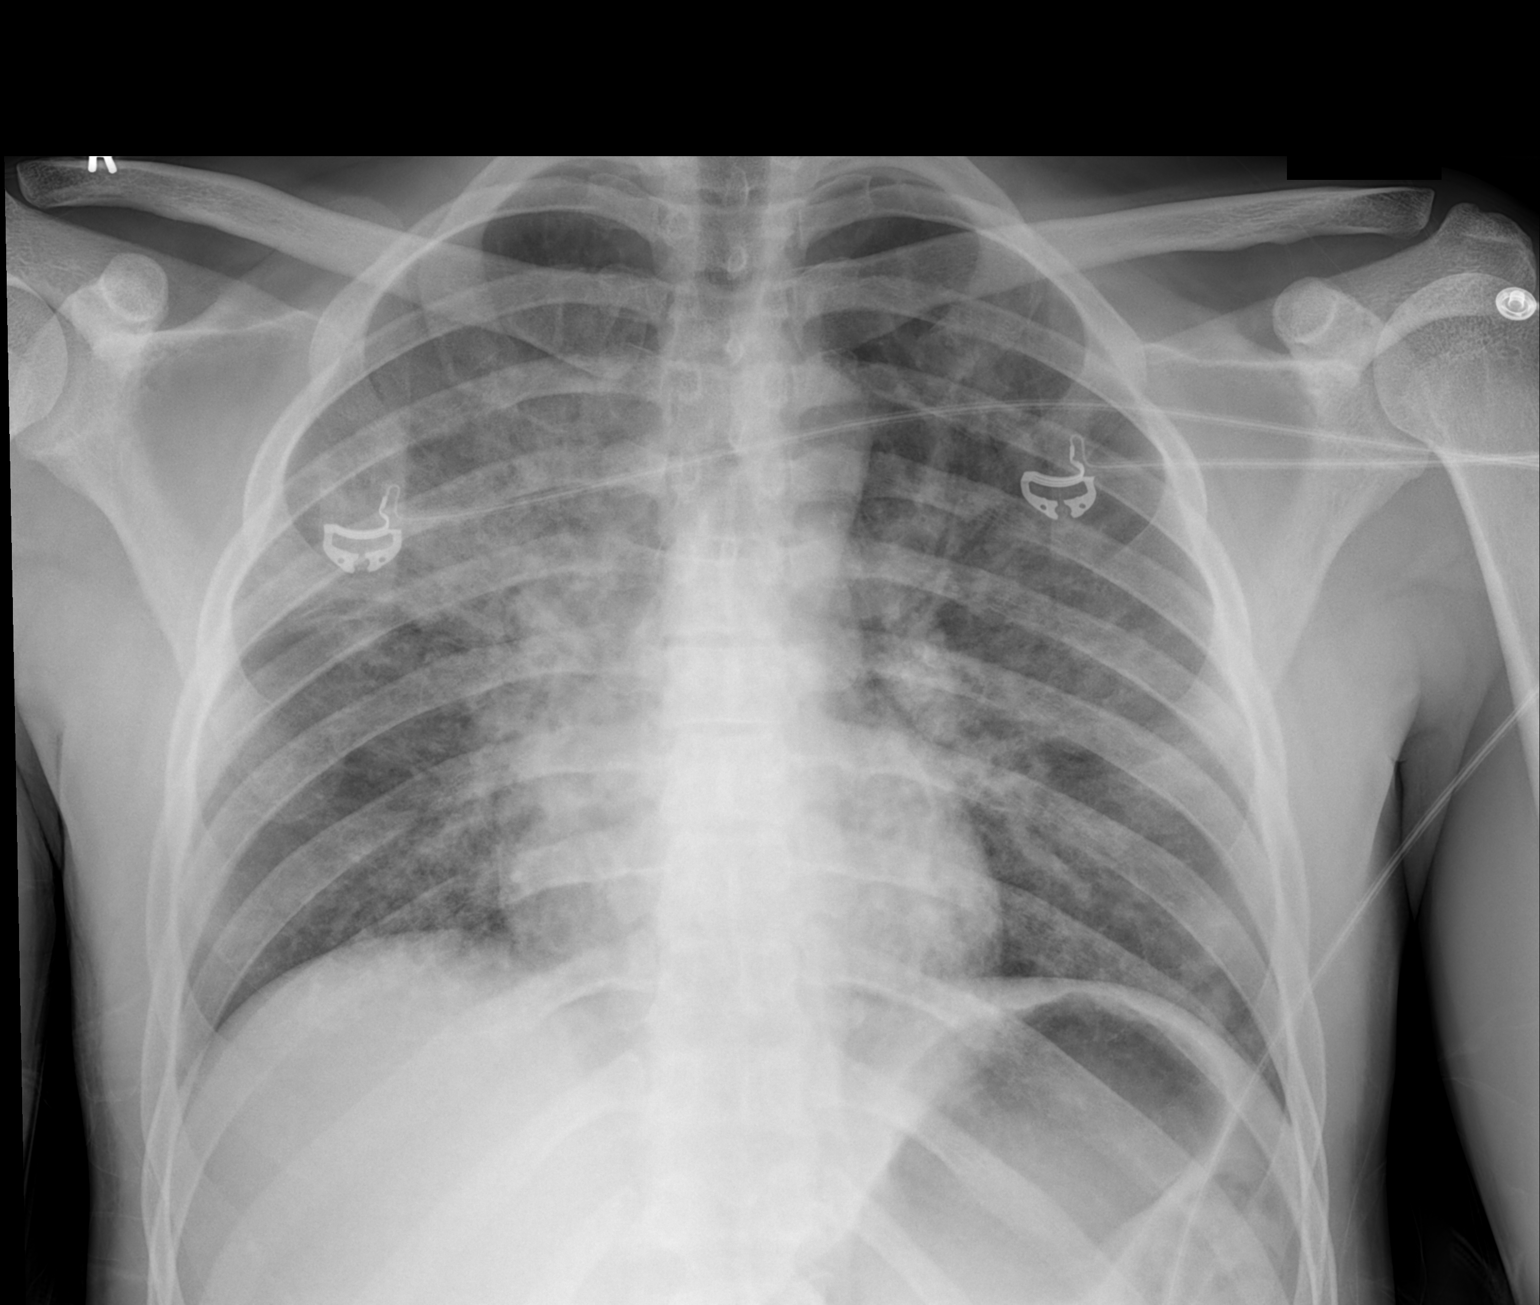

[1 of 1 positions shown; findings below may reference images not displayed]

FINDINGS: Stable cardiomediastinal silhouette with normal heart size. No
pneumothorax. No pleural effusion. There is worsening extensive
patchy consolidation throughout the central lungs bilaterally, most
prominent in the parahilar right upper lung.
IMPRESSION: 1. No pneumothorax.
2. Worsening extensive patchy consolidation throughout the bilateral
central lungs, most prominent in the right upper parahilar lung.
Differential includes multifocal pneumonia or alveolar hemorrhage.

## 2020-06-14 DEATH — deceased
